# Patient Record
Sex: Female | Born: 1960 | Race: White | Hispanic: No | Smoking: Never smoker
Health system: Southern US, Community
[De-identification: ages and names within clinical notes are randomized; demographics above are authoritative.]

## PROBLEM LIST (undated history)

## (undated) DIAGNOSIS — J302 Other seasonal allergic rhinitis: Secondary | ICD-10-CM

## (undated) DIAGNOSIS — S72009A Fracture of unspecified part of neck of unspecified femur, initial encounter for closed fracture: Secondary | ICD-10-CM

## (undated) DIAGNOSIS — M419 Scoliosis, unspecified: Secondary | ICD-10-CM

## (undated) DIAGNOSIS — I341 Nonrheumatic mitral (valve) prolapse: Secondary | ICD-10-CM

## (undated) DIAGNOSIS — R7303 Prediabetes: Secondary | ICD-10-CM

## (undated) DIAGNOSIS — E039 Hypothyroidism, unspecified: Secondary | ICD-10-CM

## (undated) DIAGNOSIS — I10 Essential (primary) hypertension: Secondary | ICD-10-CM

## (undated) HISTORY — DX: Nonrheumatic mitral (valve) prolapse: I34.1

## (undated) HISTORY — DX: Fracture of unspecified part of neck of unspecified femur, initial encounter for closed fracture: S72.009A

## (undated) HISTORY — DX: Scoliosis, unspecified: M41.9

## (undated) HISTORY — PX: WISDOM TOOTH EXTRACTION: SHX21

---
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
## 1985-03-12 DIAGNOSIS — S72009A Fracture of unspecified part of neck of unspecified femur, initial encounter for closed fracture: Secondary | ICD-10-CM

## 1985-03-12 HISTORY — DX: Fracture of unspecified part of neck of unspecified femur, initial encounter for closed fracture: S72.009A

## 2012-06-04 LAB — PROGESTERONE
Progression: 20
Vitamin D3 1, 25 (OH)2: 25

## 2012-06-04 LAB — HEMOGLOBIN A1C: HEMOGLOBIN A1C: 5.5 % (ref 4.0–6.0)

## 2012-06-04 LAB — ESTRADIOL: Estradiol: 26

## 2013-12-02 LAB — LIPID PANEL
CHOLESTEROL: 178 mg/dL (ref 0–200)
HDL: 56 mg/dL (ref 35–70)
LDL Cholesterol: 91 mg/dL
Triglycerides: 154 mg/dL (ref 40–160)

## 2013-12-02 LAB — CBC AND DIFFERENTIAL
Hemoglobin: 13.1 g/dL (ref 12.0–16.0)
Platelets: 287 10*3/uL (ref 150–399)
WBC: 5.9 10^3/mL

## 2013-12-02 LAB — BASIC METABOLIC PANEL
Creatinine: 0.8 mg/dL (ref 0.5–1.1)
Glucose: 83 mg/dL
POTASSIUM: 4.1 mmol/L (ref 3.4–5.3)
Sodium: 141 mmol/L (ref 137–147)

## 2013-12-02 LAB — CMP14+EGFR
Calcium: 8.9 mg/dL
Chloride: 105 mmol/L

## 2013-12-02 LAB — HEPATIC FUNCTION PANEL
ALT: 59 U/L — AB (ref 7–35)
AST: 30 U/L (ref 13–35)

## 2013-12-02 LAB — HEMOGLOBIN A1C: Hgb A1c MFr Bld: 5.6 % (ref 4.0–6.0)

## 2014-05-18 ENCOUNTER — Ambulatory Visit (INDEPENDENT_AMBULATORY_CARE_PROVIDER_SITE_OTHER): Payer: Self-pay | Admitting: Family Medicine

## 2014-05-18 ENCOUNTER — Encounter: Payer: Self-pay | Admitting: Family Medicine

## 2014-05-18 VITALS — BP 146/87 | HR 86 | Ht 67.0 in | Wt 216.0 lb

## 2014-05-18 DIAGNOSIS — R030 Elevated blood-pressure reading, without diagnosis of hypertension: Secondary | ICD-10-CM

## 2014-05-18 DIAGNOSIS — E038 Other specified hypothyroidism: Secondary | ICD-10-CM

## 2014-05-18 DIAGNOSIS — R7301 Impaired fasting glucose: Secondary | ICD-10-CM

## 2014-05-18 DIAGNOSIS — E559 Vitamin D deficiency, unspecified: Secondary | ICD-10-CM | POA: Insufficient documentation

## 2014-05-18 DIAGNOSIS — Z79890 Hormone replacement therapy (postmenopausal): Secondary | ICD-10-CM

## 2014-05-18 DIAGNOSIS — Z780 Asymptomatic menopausal state: Secondary | ICD-10-CM

## 2014-05-18 DIAGNOSIS — E039 Hypothyroidism, unspecified: Secondary | ICD-10-CM | POA: Insufficient documentation

## 2014-05-18 DIAGNOSIS — IMO0001 Reserved for inherently not codable concepts without codable children: Secondary | ICD-10-CM

## 2014-05-18 DIAGNOSIS — J309 Allergic rhinitis, unspecified: Secondary | ICD-10-CM | POA: Insufficient documentation

## 2014-05-18 DIAGNOSIS — J302 Other seasonal allergic rhinitis: Secondary | ICD-10-CM

## 2014-05-18 MED ORDER — THYROID 32.5 MG PO TABS: 32.5000 mg | ORAL_TABLET | Freq: Two times a day (BID) | ORAL | Status: DC

## 2014-05-18 NOTE — Progress Notes (Signed)
Subjective:    Patient ID: Kayla Taylor, female    DOB: 07-Feb-1961, 54 y.o.   MRN: [id number]  HPI Patient is here today to establish care. Her previous primary care provider is no longer covered under her new insurance plan she has a new job. She is on hormone replacement therapy and has a GYN. Though she is in the middle of switching GYN because of insurance reasons. She is addressing some bleeding issues with them. She is a Education administrator at [employer]. She is widowed and lives alone.  She is currently on estradiol tab & Progesterone At Bedtime. She Also Nita Sells Thyroid 32.5 Mg Tab Twice a Day.  Has been on these for about 2.5-3 yrs.  Her hotflashes have stopped.  Thyroid has been ajusted several times over the last few years.  Uses the progesterone at bedtime to help her sleep and works well.    She also takes several supplements including a multivitamin, joint supplement, Zyrtec, vitamin D3, pregnant alone, melatonin and Aleve as needed.  Also takes maca to help promote testosterone levels.   Review of Systems  Constitutional: Negative for fever, diaphoresis and unexpected weight change.  HENT: Negative for hearing loss, rhinorrhea and tinnitus.   Eyes: Negative for visual disturbance.  Respiratory: Negative for cough and wheezing.   Cardiovascular: Negative for chest pain and palpitations.  Gastrointestinal: Negative for nausea, vomiting, diarrhea and blood in stool.  Genitourinary: Positive for vaginal bleeding. Negative for vaginal discharge and difficulty urinating.  Musculoskeletal: Negative for myalgias and arthralgias.  Skin: Negative for rash.  Neurological: Negative for headaches.  Hematological: Negative for adenopathy. Does not bruise/bleed easily.  Psychiatric/Behavioral: Negative for sleep disturbance and dysphoric mood. The patient is not nervous/anxious.    BP 146/87 mmHg  Pulse 86  Ht 5\' 7"  (1.702 m)  Wt 216 lb (97.977 kg)  BMI 33.82  kg/m2  SpO2 94%    Allergies  Allergen Reactions  . Sulfa Antibiotics Rash    No past medical history on file.  No past surgical history on file.  History   Social History  . Marital Status: Widowed    Spouse Name: N/A  . Number of Children: N/A  . Years of Education: N/A   Occupational History  . Not on file.   Social History Main Topics  . Smoking status: Never Smoker   . Smokeless tobacco: Not on file  . Alcohol Use: 0.6 oz/week    1 Standard drinks or equivalent per week  . Drug Use: No  . Sexual Activity:    Partners: Male   Other Topics Concern  . Not on file   Social History Narrative    No family history on file.  Outpatient Encounter Prescriptions as of 05/18/2014  Medication Sig  . cetirizine (ZYRTEC) 10 MG tablet Take 10 mg by mouth.  . Cholecalciferol 5000 UNITS capsule Take 1 Units by mouth.   . estradiol (ESTRACE) 2 MG tablet Take 2 mg by mouth.  . Glucosamine-Chondroitin 500-400 MG CAPS Take by mouth.  . Maca 500 MG CAPS Take by mouth.  . Melatonin-GABA-Valerian 09-08-48 MG TABS Take by mouth.  . Multiple Vitamin (THERA) TABS Take 1 tablet by mouth.  . naproxen sodium (ANAPROX) 220 MG tablet Take 220 mg by mouth 2 (two) times daily with a meal.  . Pregnenolone Micronized POWD by Does not apply route.  . progesterone (ENDOMETRIN) 100 MG vaginal insert Place 100 mg vaginally.  . [DISCONTINUED] thyroid (ARMOUR) 32.5 MG  tablet Take 32.5 mg by mouth.  . thyroid (ARMOUR) 32.5 MG tablet Take 1 tablet (32.5 mg total) by mouth 2 (two) times daily. Nature thyroid 32.5mg  please          Objective:   Physical Exam  Constitutional: She is oriented to person, place, and time. She appears well-developed and well-nourished.  HENT:  Head: Normocephalic and atraumatic.  Right Ear: External ear normal.  Left Ear: External ear normal.  Nose: Nose normal.  Eyes: Conjunctivae are normal.  Neck: Neck supple. No thyromegaly present.  Cardiovascular: Normal  rate, regular rhythm and normal heart sounds.   No carotid bruits. Radial pulses 2+ bilat.   Pulmonary/Chest: Effort normal and breath sounds normal.  Lymphadenopathy:    She has no cervical adenopathy.  Neurological: She is alert and oriented to person, place, and time.  Skin: Skin is warm and dry.  Psychiatric: She has a normal mood and affect. Her behavior is normal. Judgment and thought content normal.        Assessment & Plan:  HRT - will continue current regimen. She is happy with what she is on.  Will hold off on rechecking labs right now as is doing well and she has a very high decutible.   Hypothyroid - wil recheck thyroid. Due for refills.    Vit D deficicney - She is on supplementatoin.  Due to recheck.   AR- uses Zyrtec prn for this.   Hx of IFG - due for repeat A1C.  Will call with reuslts and adjust if needed.

## 2014-05-19 ENCOUNTER — Encounter: Payer: Self-pay | Admitting: Family Medicine

## 2014-05-19 DIAGNOSIS — R7301 Impaired fasting glucose: Secondary | ICD-10-CM | POA: Insufficient documentation

## 2014-05-19 DIAGNOSIS — Z79890 Hormone replacement therapy (postmenopausal): Secondary | ICD-10-CM | POA: Insufficient documentation

## 2014-05-25 LAB — COMPLETE METABOLIC PANEL WITH GFR
ALK PHOS: 66 U/L (ref 39–117)
ALT: 19 U/L (ref 0–35)
AST: 15 U/L (ref 0–37)
Albumin: 4 g/dL (ref 3.5–5.2)
BUN: 15 mg/dL (ref 6–23)
CO2: 29 mEq/L (ref 19–32)
Calcium: 9.1 mg/dL (ref 8.4–10.5)
Chloride: 104 mEq/L (ref 96–112)
Creat: 0.83 mg/dL (ref 0.50–1.10)
GFR, EST NON AFRICAN AMERICAN: 82 mL/min
GLUCOSE: 95 mg/dL (ref 70–99)
Potassium: 4.1 mEq/L (ref 3.5–5.3)
SODIUM: 139 meq/L (ref 135–145)
Total Bilirubin: 0.4 mg/dL (ref 0.2–1.2)
Total Protein: 6.7 g/dL (ref 6.0–8.3)

## 2014-05-25 LAB — LIPID PANEL
Cholesterol: 168 mg/dL (ref 0–200)
HDL: 57 mg/dL (ref >=46)
LDL CALC: 89 mg/dL (ref 0–99)
Total CHOL/HDL Ratio: 2.9 Ratio
Triglycerides: 111 mg/dL (ref <150)
VLDL: 22 mg/dL (ref 0–40)

## 2014-05-25 LAB — TSH: TSH: 3.934 u[IU]/mL (ref 0.350–4.500)

## 2014-05-25 LAB — HEMOGLOBIN A1C
HEMOGLOBIN A1C: 6 % — AB (ref <5.7)
Mean Plasma Glucose: 126 mg/dL — ABNORMAL HIGH (ref <117)

## 2014-05-25 LAB — VITAMIN D 25 HYDROXY (VIT D DEFICIENCY, FRACTURES): Vit D, 25-Hydroxy: 41 ng/mL (ref 30–100)

## 2014-05-27 ENCOUNTER — Other Ambulatory Visit: Payer: Self-pay | Admitting: *Deleted

## 2014-05-27 DIAGNOSIS — E039 Hypothyroidism, unspecified: Secondary | ICD-10-CM

## 2014-05-27 MED ORDER — THYROID 48.75 MG PO TABS: ORAL_TABLET | ORAL | Status: DC

## 2014-05-27 NOTE — Addendum Note (Signed)
Addended by: Beatrice Lecher D on: 05/27/2014 12:39 PM   Modules accepted: Orders

## 2014-06-02 ENCOUNTER — Encounter: Payer: Self-pay | Admitting: Family Medicine

## 2014-06-25 ENCOUNTER — Encounter: Payer: Self-pay | Admitting: Family Medicine

## 2014-07-29 ENCOUNTER — Encounter (HOSPITAL_COMMUNITY): Payer: Self-pay | Admitting: Licensed Clinical Social Worker

## 2014-07-29 ENCOUNTER — Ambulatory Visit (INDEPENDENT_AMBULATORY_CARE_PROVIDER_SITE_OTHER): Payer: Commercial Managed Care - PPO | Admitting: Licensed Clinical Social Worker

## 2014-07-29 DIAGNOSIS — F328 Other depressive episodes: Secondary | ICD-10-CM | POA: Diagnosis not present

## 2014-07-29 DIAGNOSIS — F341 Dysthymic disorder: Secondary | ICD-10-CM
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
## 2014-08-06 ENCOUNTER — Telehealth: Payer: Self-pay

## 2014-08-06 MED ORDER — ESTRADIOL 2 MG PO TABS: 2.0000 mg | ORAL_TABLET | Freq: Every day | ORAL | Status: DC

## 2014-08-06 NOTE — Telephone Encounter (Signed)
Ok to refill 

## 2014-08-06 NOTE — Telephone Encounter (Signed)
Patient called for a refill of estradiol. Historical medication.

## 2014-08-06 NOTE — Telephone Encounter (Signed)
Refill sent.

## 2014-08-10 ENCOUNTER — Ambulatory Visit (HOSPITAL_COMMUNITY): Payer: Self-pay | Admitting: Licensed Clinical Social Worker

## 2014-08-26 ENCOUNTER — Ambulatory Visit (HOSPITAL_COMMUNITY): Payer: Self-pay | Admitting: Licensed Clinical Social Worker

## 2014-09-06 ENCOUNTER — Ambulatory Visit (HOSPITAL_COMMUNITY): Payer: Self-pay | Admitting: Licensed Clinical Social Worker

## 2014-09-29 ENCOUNTER — Ambulatory Visit (HOSPITAL_COMMUNITY): Payer: Self-pay | Admitting: Licensed Clinical Social Worker

## 2014-10-05 ENCOUNTER — Ambulatory Visit (INDEPENDENT_AMBULATORY_CARE_PROVIDER_SITE_OTHER): Payer: Managed Care, Other (non HMO) | Admitting: Family Medicine

## 2014-10-05 ENCOUNTER — Encounter: Payer: Self-pay | Admitting: Family Medicine

## 2014-10-05 VITALS — BP 159/100 | HR 89 | Ht 67.0 in | Wt 215.0 lb

## 2014-10-05 DIAGNOSIS — E038 Other specified hypothyroidism: Secondary | ICD-10-CM | POA: Diagnosis not present

## 2014-10-05 DIAGNOSIS — L02419 Cutaneous abscess of limb, unspecified: Secondary | ICD-10-CM

## 2014-10-05 MED ORDER — DOXYCYCLINE HYCLATE 100 MG PO TABS: 100.0000 mg | ORAL_TABLET | Freq: Two times a day (BID) | ORAL | Status: DC

## 2014-10-05 MED ORDER — THYROID 48.75 MG PO TABS: ORAL_TABLET | ORAL | Status: DC

## 2014-10-05 NOTE — Patient Instructions (Signed)

## 2014-10-05 NOTE — Progress Notes (Signed)
   Subjective:    Patient ID: Kayla Taylor, female    DOB: [birth year], 54 y.o.   MRN: [id number]  HPI Abscess x 3wks started hard and got bigger opened up on sunday and is draining some.  No fever, chills or sweats.  No red streaks.  She says it is smaller than it was a week ago.  No worsening or alleviating factos.    Hypothyroidism- due to recheck levels. We actually increased her thyroid medication about 3 months ago. She's due to recheck level.  Review of Systems     Objective:   Physical Exam  Constitutional: She appears well-developed and well-nourished.  HENT:  Head: Normocephalic and atraumatic.  Musculoskeletal:  She has a 3.5 cm abscess under the left axilla.  The is a small wound on the surface with a scab.  No streaking erythema or sign of cellulitis. It is tender to palpation. Unable to actively express any drainage from the wound.  Skin: Skin is warm and dry.  Psychiatric: She has a normal mood and affect. Her behavior is normal.          Assessment & Plan:  Abscess - offered to drain it but she would prefer to try an antibiotic since still draining some.  Discussed the importance of returning if it does not go down by the end of the week or come in sooner if she develops any red streaks around the area, it gets larger or she develops any fevers chills or sweats. Continue with warm compresses to encourage drainage.  Hypothyroidism-due to recheck TSH. Adjust dose if needed.

## 2014-10-06 LAB — TSH: TSH: 2.006 u[IU]/mL (ref 0.350–4.500)

## 2014-10-13 ENCOUNTER — Ambulatory Visit (HOSPITAL_COMMUNITY): Payer: Self-pay | Admitting: Licensed Clinical Social Worker

## 2014-10-27 ENCOUNTER — Ambulatory Visit (HOSPITAL_COMMUNITY): Payer: Self-pay | Admitting: Licensed Clinical Social Worker

## 2014-11-10 ENCOUNTER — Ambulatory Visit (HOSPITAL_COMMUNITY): Payer: Self-pay | Admitting: Licensed Clinical Social Worker

## 2014-11-11 ENCOUNTER — Encounter: Payer: Self-pay | Admitting: Obstetrics & Gynecology

## 2014-11-11 ENCOUNTER — Ambulatory Visit (INDEPENDENT_AMBULATORY_CARE_PROVIDER_SITE_OTHER): Payer: Managed Care, Other (non HMO) | Admitting: Obstetrics & Gynecology

## 2014-11-11 VITALS — BP 148/84 | HR 88 | Ht 67.0 in | Wt 214.0 lb

## 2014-11-11 DIAGNOSIS — N950 Postmenopausal bleeding: Secondary | ICD-10-CM

## 2014-11-11 DIAGNOSIS — Z0000 Encounter for general adult medical examination without abnormal findings: Secondary | ICD-10-CM

## 2014-11-11 MED ORDER — MISOPROSTOL 200 MCG PO TABS: ORAL_TABLET | ORAL | Status: DC

## 2014-11-11 NOTE — Progress Notes (Signed)
   Subjective:    Patient ID: Kayla Taylor, female    DOB: Aug 11, 1960, 54 y.o.   MRN: [id number]  HPI  54 yo SWP0 here with the complaint of PMB. Menopause about 2 1/2 years ago. She started having bleeding 11-15.   Review of Systems Pap 10/15, mammogram at 54 yo.    Objective:   Physical Exam WNWHWFNAD Breathing, conversing, and ambulating normally       Assessment & Plan:  Preventative- schedule mammo PMB- check gyn u/s Pretreat with cytotec prior to [facility]

## 2014-11-18 ENCOUNTER — Ambulatory Visit: Payer: Commercial Managed Care - PPO | Admitting: Family Medicine

## 2014-11-19 ENCOUNTER — Ambulatory Visit (INDEPENDENT_AMBULATORY_CARE_PROVIDER_SITE_OTHER): Payer: Managed Care, Other (non HMO)

## 2014-11-19 DIAGNOSIS — N950 Postmenopausal bleeding: Secondary | ICD-10-CM

## 2014-11-25 ENCOUNTER — Ambulatory Visit: Payer: Self-pay | Admitting: Obstetrics & Gynecology

## 2014-11-29 ENCOUNTER — Telehealth: Payer: Self-pay | Admitting: *Deleted

## 2014-11-29 NOTE — Telephone Encounter (Signed)
-----   Message from Emily Filbert, MD sent at 11/22/2014  9:53 AM EDT ----- Please make sure that she has an appt for a EMBX and pretreats with cytotec (I already prescribed it). Thanks

## 2014-11-29 NOTE — Telephone Encounter (Signed)
LM on voicemail that Dr Hulan Fray would like to do an Endometrial biopsy @ tomorrow's appt @ 4:00.  There is a RX at her pharmacy for Cytotec and she needs to pick it up today as use as prescribed.

## 2014-11-30 ENCOUNTER — Other Ambulatory Visit: Payer: Self-pay | Admitting: *Deleted

## 2014-11-30 ENCOUNTER — Ambulatory Visit: Payer: Self-pay | Admitting: Obstetrics & Gynecology

## 2014-11-30 DIAGNOSIS — Z01812 Encounter for preprocedural laboratory examination: Secondary | ICD-10-CM

## 2014-11-30 MED ORDER — MISOPROSTOL 200 MCG PO TABS: ORAL_TABLET | ORAL | Status: DC

## 2014-11-30 NOTE — Telephone Encounter (Signed)
Pt's appt for Endo BX was cancelled today due to Dr Hulan Fray being called back to the OR for an emergency.  She had already taken her cytotec in preparation for her procedure today so I called in a RF for Cytotec for her procedure on Thursday.

## 2014-12-02 ENCOUNTER — Encounter: Payer: Self-pay | Admitting: Obstetrics & Gynecology

## 2014-12-02 ENCOUNTER — Ambulatory Visit (INDEPENDENT_AMBULATORY_CARE_PROVIDER_SITE_OTHER): Payer: Managed Care, Other (non HMO) | Admitting: Obstetrics & Gynecology

## 2014-12-02 VITALS — BP 140/90 | HR 86 | Resp 16 | Ht 67.0 in | Wt 214.0 lb

## 2014-12-02 DIAGNOSIS — N950 Postmenopausal bleeding: Secondary | ICD-10-CM | POA: Diagnosis not present

## 2014-12-02 DIAGNOSIS — Z01812 Encounter for preprocedural laboratory examination: Secondary | ICD-10-CM

## 2014-12-02 LAB — POCT URINE PREGNANCY: Preg Test, Ur: NEGATIVE

## 2014-12-02 NOTE — Progress Notes (Signed)
   Subjective:    Patient ID: Kayla Taylor, female    DOB: 08-06-1960, 54 y.o.   MRN: [id number]  HPI  54 yo lady with PMB here for [facility]. She took cytotec last night.  Review of Systems     Objective:   Physical Exam WNWHWFNAD  UPT negative, consent signed, time out done Cervix prepped with betadine and grasped with a single tooth tenaculum Uterus sounded to 9 cm Pipelle used for 2 passes with a moderate amount of tissue obtained. She tolerated the procedure well.        Assessment & Plan:   PMB- await pathology

## 2014-12-08 ENCOUNTER — Telehealth: Payer: Self-pay | Admitting: *Deleted

## 2014-12-08 NOTE — Telephone Encounter (Signed)
LM on cell phone of path report not showing hyperplasia.

## 2014-12-13 ENCOUNTER — Encounter: Payer: Self-pay | Admitting: Obstetrics & Gynecology

## 2014-12-13 ENCOUNTER — Ambulatory Visit (INDEPENDENT_AMBULATORY_CARE_PROVIDER_SITE_OTHER): Payer: Managed Care, Other (non HMO) | Admitting: Obstetrics & Gynecology

## 2014-12-13 VITALS — BP 140/96 | HR 88 | Resp 16

## 2014-12-13 DIAGNOSIS — N950 Postmenopausal bleeding: Secondary | ICD-10-CM

## 2014-12-13 MED ORDER — MEDROXYPROGESTERONE ACETATE 10 MG PO TABS: 10.0000 mg | ORAL_TABLET | Freq: Every day | ORAL | Status: DC

## 2014-12-13 NOTE — Progress Notes (Signed)
   Subjective:    Patient ID: Kayla Taylor, female    DOB: 1961-01-12, 54 y.o.   MRN: [id number]  HPI  54 yo lady with PMB here for results. Pathology negative with polypoid tissue.  Review of Systems     Objective:   Physical Exam WNWHWFNAD Breathing, conversing, and ambulating normally       Assessment & Plan:  PMB- none since last month, Pathol. Negative I offered watchful waiting with d&c/h/s if PMB recurrs, going ahead with d&c/h/s now versus provera challenge. She opts for progestin. RTC prn/prn PMB

## 2014-12-16 ENCOUNTER — Ambulatory Visit: Payer: Self-pay | Admitting: Obstetrics & Gynecology

## 2014-12-23 ENCOUNTER — Ambulatory Visit: Payer: Self-pay | Admitting: Family Medicine

## 2015-01-01 ENCOUNTER — Emergency Department (INDEPENDENT_AMBULATORY_CARE_PROVIDER_SITE_OTHER): Payer: Managed Care, Other (non HMO)

## 2015-01-01 ENCOUNTER — Emergency Department
Admission: EM | Admit: 2015-01-01 | Discharge: 2015-01-01 | Disposition: A | Discharge status: Home / Self Care | Payer: Managed Care, Other (non HMO) | Source: Home / Self Care | Attending: Family Medicine | Admitting: Family Medicine

## 2015-01-01 DIAGNOSIS — J189 Pneumonia, unspecified organism: Secondary | ICD-10-CM

## 2015-01-01 DIAGNOSIS — J181 Lobar pneumonia, unspecified organism: Principal | ICD-10-CM

## 2015-01-01 DIAGNOSIS — J9811 Atelectasis: Secondary | ICD-10-CM

## 2015-01-01 LAB — POCT INFLUENZA A/B
Influenza A, POC: NEGATIVE
Influenza B, POC: NEGATIVE

## 2015-01-01 MED ORDER — BENZONATATE 100 MG PO CAPS: 100.0000 mg | ORAL_CAPSULE | Freq: Three times a day (TID) | ORAL | Status: DC

## 2015-01-01 MED ORDER — AZITHROMYCIN 250 MG PO TABS: 250.0000 mg | ORAL_TABLET | Freq: Every day | ORAL | Status: DC

## 2015-01-01 NOTE — ED Notes (Signed)
Patient states that she started feeling bad on Thursday, 12/30/14. States that she has had a scratchy throat and cough and has not felt well.

## 2015-01-01 NOTE — Discharge Instructions (Signed)
Please take antibiotics as prescribed and be sure to complete entire course even if you start to feel better to ensure infection does not come back. You may take 400-600mg  Ibuprofen (Motrin) every 6-8 hours for fever and pain  Alternate with Tylenol  You may take 500mg  Tylenol every 4-6 hours as needed for fever and pain  Follow-up with your primary care provider next week for recheck of symptoms if not improving.  Be sure to drink plenty of fluids and rest, at least 8hrs of sleep a night, preferably more while you are sick. Return urgent care or go to closest ER if you cannot keep down fluids/signs of dehydration, fever not reducing with Tylenol, difficulty breathing/wheezing, stiff neck, worsening condition, or other concerns (see below)    Community-Acquired Pneumonia, Adult Pneumonia is an infection of the lungs. There are different types of pneumonia. One type can develop while a person is in a hospital. A different type, called community-acquired pneumonia, develops in people who are not, or have not recently been, in the hospital or other health care facility.  CAUSES Pneumonia may be caused by bacteria, viruses, or funguses. Community-acquired pneumonia is often caused by Streptococcus pneumonia bacteria. These bacteria are often passed from one person to another by breathing in droplets from the cough or sneeze of an infected person. RISK FACTORS The condition is more likely to develop in:  People who havechronic diseases, such as chronic obstructive pulmonary disease (COPD), asthma, congestive heart failure, cystic fibrosis, diabetes, or kidney disease.  People who haveearly-stage or late-stage HIV.  People who havesickle cell disease.  People who havehad their spleen removed (splenectomy).  People who havepoor Human resources officer.  People who havemedical conditions that increase the risk of breathing in (aspirating) secretions their own mouth and nose.   People who havea  weakened immune system (immunocompromised).  People who smoke.  People whotravel to areas where pneumonia-causing germs commonly exist.  People whoare around animal habitats or animals that have pneumonia-causing germs, including birds, bats, rabbits, cats, and farm animals. SYMPTOMS Symptoms of this condition include:  Adry cough.  A wet (productive) cough.  Fever.  Sweating.  Chest pain, especially when breathing deeply or coughing.  Rapid breathing or difficulty breathing.  Shortness of breath.  Shaking chills.  Fatigue.  Muscle aches. DIAGNOSIS Your health care provider will take a medical history and perform a physical exam. You may also have other tests, including:  Imaging studies of your chest, including X-rays.  Tests to check your blood oxygen level and other blood gases.  Other tests on blood, mucus (sputum), fluid around your lungs (pleural fluid), and urine. If your pneumonia is severe, other tests may be done to identify the specific cause of your illness. TREATMENT The type of treatment that you receive depends on many factors, such as the cause of your pneumonia, the medicines you take, and other medical conditions that you have. For most adults, treatment and recovery from pneumonia may occur at home. In some cases, treatment must happen in a hospital. Treatment may include:  Antibiotic medicines, if the pneumonia was caused by bacteria.  Antiviral medicines, if the pneumonia was caused by a virus.  Medicines that are given by mouth or through an IV tube.  Oxygen.  Respiratory therapy. Although rare, treating severe pneumonia may include:  Mechanical ventilation. This is done if you are not breathing well on your own and you cannot maintain a safe blood oxygen level.  Thoracentesis. This procedureremoves fluid around one lung  or both lungs to help you breathe better. HOME CARE INSTRUCTIONS  Take over-the-counter and prescription medicines  only as told by your health care provider.  Only takecough medicine if you are losing sleep. Understand that cough medicine can prevent your body's natural ability to remove mucus from your lungs.  If you were prescribed an antibiotic medicine, take it as told by your health care provider. Do not stop taking the antibiotic even if you start to feel better.  Sleep in a semi-upright position at night. Try sleeping in a reclining chair, or place a few pillows under your head.  Do not use tobacco products, including cigarettes, chewing tobacco, and e-cigarettes. If you need help quitting, ask your health care provider.  Drink enough water to keep your urine clear or pale yellow. This will help to thin out mucus secretions in your lungs. PREVENTION There are ways that you can decrease your risk of developing community-acquired pneumonia. Consider getting a pneumococcal vaccine if:  You are older than 52 years of age.  You are older than 53 years of age and are undergoing cancer treatment, have chronic lung disease, or have other medical conditions that affect your immune system. Ask your health care provider if this applies to you. There are different types and schedules of pneumococcal vaccines. Ask your health care provider which vaccination option is best for you. You may also prevent community-acquired pneumonia if you take these actions:  Get an influenza vaccine every year. Ask your health care provider which type of influenza vaccine is best for you.  Go to the dentist on a regular basis.  Wash your hands often. Use hand sanitizer if soap and water are not available. SEEK MEDICAL CARE IF:  You have a fever.  You are losing sleep because you cannot control your cough with cough medicine. SEEK IMMEDIATE MEDICAL CARE IF:  You have worsening shortness of breath.  You have increased chest pain.  Your sickness becomes worse, especially if you are an older adult or have a weakened  immune system.  You cough up blood.   This information is not intended to replace advice given to you by your health care provider. Make sure you discuss any questions you have with your health care provider.   Document Released: 02/26/2005 Document Revised: 11/17/2014 Document Reviewed: 06/23/2014 Elsevier Interactive Patient Education [employer].
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
## 2015-01-10 ENCOUNTER — Other Ambulatory Visit: Payer: Self-pay | Admitting: Family Medicine

## 2015-01-25 ENCOUNTER — Encounter: Payer: Self-pay | Admitting: Family Medicine

## 2015-01-25 ENCOUNTER — Ambulatory Visit (INDEPENDENT_AMBULATORY_CARE_PROVIDER_SITE_OTHER): Payer: Managed Care, Other (non HMO) | Admitting: Family Medicine

## 2015-01-25 VITALS — BP 145/99 | HR 90 | Wt 211.1 lb

## 2015-01-25 DIAGNOSIS — E039 Hypothyroidism, unspecified: Secondary | ICD-10-CM

## 2015-01-25 DIAGNOSIS — R7301 Impaired fasting glucose: Secondary | ICD-10-CM | POA: Diagnosis not present

## 2015-01-25 DIAGNOSIS — Z1231 Encounter for screening mammogram for malignant neoplasm of breast: Secondary | ICD-10-CM | POA: Diagnosis not present

## 2015-01-25 LAB — POCT GLYCOSYLATED HEMOGLOBIN (HGB A1C): HEMOGLOBIN A1C: 5.8

## 2015-01-25 NOTE — Progress Notes (Signed)
   Subjective:    Patient ID: Kayla Taylor, female    DOB: [birth year], 54 y.o.   MRN: [id number]  HPI IFG - no increased thirst or urination. Last 11 A1c was 8 months ago and it was 6.0. She's not actively exercising.  Hypothyroidism - doing well on her current regimen. She is on thyroid 48.75 mg daily. Reaction increased her dose about 8 months ago. Repeat lab in July looked great. She still happy with her current regimen. No recent skin or hair changes. She is down 4 lbs.     Review of Systems     Objective:   Physical Exam  Constitutional: She is oriented to person, place, and time. She appears well-developed and well-nourished.  HENT:  Head: Normocephalic and atraumatic.  Neck: Neck supple. No thyromegaly present.  Cardiovascular: Normal rate, regular rhythm and normal heart sounds.   Pulmonary/Chest: Effort normal and breath sounds normal.  Musculoskeletal: She exhibits no edema.  Lymphadenopathy:    She has no cervical adenopathy.  Neurological: She is alert and oriented to person, place, and time.  Skin: Skin is warm and dry.  Psychiatric: She has a normal mood and affect. Her behavior is normal.          Assessment & Plan:  Impaired fasting glucose -well controlled. A1C is 5.9 . F/U in 6 months. She is doing fantastic. Did encourage her to get on track with diet and exercise.  Hypothyroidism - doing well. Happy with current regimen.  The adjustment to her regimen has been good. Follow-up in 6 months.

## 2015-02-11 ENCOUNTER — Other Ambulatory Visit: Payer: Self-pay | Admitting: Family Medicine

## 2015-02-21 ENCOUNTER — Other Ambulatory Visit: Payer: Self-pay | Admitting: Family Medicine

## 2015-03-31 ENCOUNTER — Ambulatory Visit (INDEPENDENT_AMBULATORY_CARE_PROVIDER_SITE_OTHER): Payer: Managed Care, Other (non HMO) | Admitting: Family Medicine

## 2015-03-31 ENCOUNTER — Encounter: Payer: Self-pay | Admitting: Family Medicine

## 2015-03-31 VITALS — BP 151/90 | HR 93 | Temp 99.6°F | Ht 67.0 in | Wt 207.0 lb

## 2015-03-31 DIAGNOSIS — J019 Acute sinusitis, unspecified: Secondary | ICD-10-CM | POA: Diagnosis not present

## 2015-03-31 DIAGNOSIS — J209 Acute bronchitis, unspecified: Secondary | ICD-10-CM

## 2015-03-31 MED ORDER — AMOXICILLIN-POT CLAVULANATE 875-125 MG PO TABS: 1.0000 | ORAL_TABLET | Freq: Two times a day (BID) | ORAL | Status: DC

## 2015-03-31 NOTE — Progress Notes (Signed)
   Subjective:    Patient ID: Kayla Taylor, female    DOB: Jul 28, 1960, 55 y.o.   MRN: [id number]  HPI 7 days of sinus congestion, cough. No fever, chills.  Hearing some crackles in her lungs. + low grade temp around 100. She is suing Dayquail and Nyquail and Affrin.  No HA. Concerned because she asked had pneumonia back in October but says she doesn't feel as bad now she did then. No shortness of breath. No worsening or alleviating factors.   Review of Systems     Objective:   Physical Exam  Constitutional: She is oriented to person, place, and time. She appears well-developed and well-nourished.  HENT:  Head: Normocephalic and atraumatic.  Cardiovascular: Normal rate, regular rhythm and normal heart sounds.   Pulmonary/Chest: Effort normal and breath sounds normal.  Neurological: She is alert and oriented to person, place, and time.  Skin: Skin is warm and dry.  Psychiatric: She has a normal mood and affect. Her behavior is normal.          Assessment & Plan:  Acute sinusitis/bronchits - treat with Augmentin. Please call back if not significantly better by Monday of next week. Okay to continue symptomatic care as she may need to back off on the DayQuil and NyQuil as I do think it may be elevating her blood pressure.

## 2015-04-18 ENCOUNTER — Other Ambulatory Visit: Payer: Self-pay | Admitting: Family Medicine

## 2015-05-30 ENCOUNTER — Encounter: Payer: Self-pay | Admitting: Obstetrics & Gynecology

## 2015-05-30 ENCOUNTER — Ambulatory Visit (INDEPENDENT_AMBULATORY_CARE_PROVIDER_SITE_OTHER): Payer: Managed Care, Other (non HMO) | Admitting: Obstetrics & Gynecology

## 2015-05-30 VITALS — BP 166/96 | HR 79 | Resp 16 | Ht 67.0 in | Wt 213.0 lb

## 2015-05-30 DIAGNOSIS — N950 Postmenopausal bleeding: Secondary | ICD-10-CM | POA: Diagnosis not present

## 2015-05-30 MED ORDER — MISOPROSTOL 200 MCG PO TABS: ORAL_TABLET | ORAL | Status: DC

## 2015-05-30 NOTE — Progress Notes (Signed)
   Subjective:    Patient ID: Kayla Taylor, female    DOB: 05/20/1960, 55 y.o.   MRN: [id number]  HPI  55 yo [employer] P0 here today to discuss the recurrence of PMB over the past 32 weeks. She had this same issue about 53 months ago. Her u/s showed a small fribroid and her uterine linging was about 10 cm. Her EMBX then was normal. She has been taking HRT for about 4 years. Takes estradiol 2 mg daily and progesterone 100 mg orally nightly (prometrium).   Review of Systems She has MVP but does not get antibiotics with dental cleanings.    Objective:   Physical Exam [id number] Breathing, conversing, and ambulating normally       Assessment & Plan:  Second occurrence of PMB- I will schedule her for a hysteroscopy and d&c. Pretreat with cytotec

## 2015-06-01 ENCOUNTER — Encounter (HOSPITAL_COMMUNITY): Payer: Self-pay | Admitting: *Deleted

## 2015-06-02 ENCOUNTER — Other Ambulatory Visit: Payer: Self-pay | Admitting: *Deleted

## 2015-06-02 MED ORDER — PROGESTERONE MICRONIZED 100 MG PO CAPS: 100.0000 mg | ORAL_CAPSULE | Freq: Every day | ORAL | Status: DC

## 2015-06-06 ENCOUNTER — Encounter (HOSPITAL_COMMUNITY): Payer: Self-pay | Admitting: *Deleted

## 2015-06-09 ENCOUNTER — Other Ambulatory Visit: Payer: Self-pay | Admitting: Family Medicine

## 2015-06-14 ENCOUNTER — Encounter (HOSPITAL_COMMUNITY): Payer: Self-pay | Admitting: *Deleted

## 2015-06-14 ENCOUNTER — Ambulatory Visit (HOSPITAL_COMMUNITY): Payer: Managed Care, Other (non HMO) | Admitting: Anesthesiology

## 2015-06-14 ENCOUNTER — Ambulatory Visit (HOSPITAL_COMMUNITY)
Admission: RE | Admit: 2015-06-14 | Discharge: 2015-06-14 | Disposition: A | Discharge status: Home / Self Care | Payer: Managed Care, Other (non HMO) | Source: Ambulatory Visit | Attending: Obstetrics & Gynecology | Admitting: Obstetrics & Gynecology

## 2015-06-14 ENCOUNTER — Encounter (HOSPITAL_COMMUNITY)
Admission: RE | Disposition: A | Discharge status: Home / Self Care | Payer: Self-pay | Source: Ambulatory Visit | Attending: Obstetrics & Gynecology

## 2015-06-14 DIAGNOSIS — E039 Hypothyroidism, unspecified: Secondary | ICD-10-CM | POA: Insufficient documentation

## 2015-06-14 DIAGNOSIS — I341 Nonrheumatic mitral (valve) prolapse: Secondary | ICD-10-CM | POA: Insufficient documentation

## 2015-06-14 DIAGNOSIS — M419 Scoliosis, unspecified: Secondary | ICD-10-CM | POA: Insufficient documentation

## 2015-06-14 DIAGNOSIS — N950 Postmenopausal bleeding: Secondary | ICD-10-CM | POA: Insufficient documentation

## 2015-06-14 HISTORY — DX: Hypothyroidism, unspecified: E03.9

## 2015-06-14 HISTORY — DX: Other seasonal allergic rhinitis: J30.2

## 2015-06-14 HISTORY — PX: HYSTEROSCOPY WITH D & C: SHX1775

## 2015-06-14 LAB — CBC
HEMATOCRIT: 40.8 % (ref 36.0–46.0)
Hemoglobin: 13.8 g/dL (ref 12.0–15.0)
MCH: 30.5 pg (ref 26.0–34.0)
MCHC: 33.8 g/dL (ref 30.0–36.0)
MCV: 90.1 fL (ref 78.0–100.0)
Platelets: 269 10*3/uL (ref 150–400)
RBC: 4.53 MIL/uL (ref 3.87–5.11)
RDW: 13.3 % (ref 11.5–15.5)
WBC: 7.4 10*3/uL (ref 4.0–10.5)

## 2015-06-14 SURGERY — DILATATION AND CURETTAGE /HYSTEROSCOPY
Anesthesia: General | Site: Vagina

## 2015-06-14 MED ORDER — LIDOCAINE HCL (CARDIAC) 20 MG/ML IV SOLN
INTRAVENOUS | Status: AC
  Filled 2015-06-14: qty 5

## 2015-06-14 MED ORDER — LACTATED RINGERS IV SOLN: INTRAVENOUS | Status: DC

## 2015-06-14 MED ORDER — BUPIVACAINE HCL (PF) 0.5 % IJ SOLN
INTRAMUSCULAR | Status: DC | PRN
  Administered 2015-06-14: 10 mL

## 2015-06-14 MED ORDER — ONDANSETRON HCL 4 MG/2ML IJ SOLN
INTRAMUSCULAR | Status: DC | PRN
  Administered 2015-06-14: 4 mg via INTRAVENOUS

## 2015-06-14 MED ORDER — DEXAMETHASONE SODIUM PHOSPHATE 10 MG/ML IJ SOLN
INTRAMUSCULAR | Status: AC
  Filled 2015-06-14: qty 1

## 2015-06-14 MED ORDER — ONDANSETRON HCL 4 MG/2ML IJ SOLN
INTRAMUSCULAR | Status: AC
  Filled 2015-06-14: qty 2

## 2015-06-14 MED ORDER — PROPOFOL 10 MG/ML IV BOLUS
INTRAVENOUS | Status: AC
  Filled 2015-06-14: qty 20

## 2015-06-14 MED ORDER — KETOROLAC TROMETHAMINE 30 MG/ML IJ SOLN
INTRAMUSCULAR | Status: AC
  Filled 2015-06-14: qty 1

## 2015-06-14 MED ORDER — LACTATED RINGERS IV SOLN
INTRAVENOUS | Status: DC
  Administered 2015-06-14 (×2): via INTRAVENOUS

## 2015-06-14 MED ORDER — FENTANYL CITRATE (PF) 100 MCG/2ML IJ SOLN
INTRAMUSCULAR | Status: DC | PRN
  Administered 2015-06-14 (×2): 50 ug via INTRAVENOUS

## 2015-06-14 MED ORDER — SODIUM CHLORIDE 0.9 % IR SOLN
Status: DC | PRN
  Administered 2015-06-14: 3000 mL

## 2015-06-14 MED ORDER — FENTANYL CITRATE (PF) 100 MCG/2ML IJ SOLN
INTRAMUSCULAR | Status: AC
  Filled 2015-06-14: qty 2

## 2015-06-14 MED ORDER — LIDOCAINE HCL (CARDIAC) 20 MG/ML IV SOLN
INTRAVENOUS | Status: DC | PRN
  Administered 2015-06-14: 100 mg via INTRAVENOUS

## 2015-06-14 MED ORDER — PROPOFOL 10 MG/ML IV BOLUS
INTRAVENOUS | Status: DC | PRN
  Administered 2015-06-14: 200 mg via INTRAVENOUS

## 2015-06-14 MED ORDER — IBUPROFEN 600 MG PO TABS: 600.0000 mg | ORAL_TABLET | Freq: Four times a day (QID) | ORAL | Status: DC | PRN

## 2015-06-14 MED ORDER — DEXAMETHASONE SODIUM PHOSPHATE 4 MG/ML IJ SOLN
INTRAMUSCULAR | Status: DC | PRN
  Administered 2015-06-14: 10 mg via INTRAVENOUS

## 2015-06-14 MED ORDER — MIDAZOLAM HCL 5 MG/5ML IJ SOLN
INTRAMUSCULAR | Status: DC | PRN
  Administered 2015-06-14: 2 mg via INTRAVENOUS

## 2015-06-14 MED ORDER — MIDAZOLAM HCL 2 MG/2ML IJ SOLN
INTRAMUSCULAR | Status: AC
  Filled 2015-06-14: qty 2

## 2015-06-14 MED ORDER — FENTANYL CITRATE (PF) 100 MCG/2ML IJ SOLN: 25.0000 ug | INTRAMUSCULAR | Status: DC | PRN

## 2015-06-14 MED ORDER — SCOPOLAMINE 1 MG/3DAYS TD PT72
1.0000 | MEDICATED_PATCH | Freq: Once | TRANSDERMAL | Status: DC
  Administered 2015-06-14: 1.5 mg via TRANSDERMAL

## 2015-06-14 MED ORDER — SCOPOLAMINE 1 MG/3DAYS TD PT72
MEDICATED_PATCH | TRANSDERMAL | Status: AC
  Filled 2015-06-14: qty 1

## 2015-06-14 MED ORDER — KETOROLAC TROMETHAMINE 30 MG/ML IJ SOLN
INTRAMUSCULAR | Status: DC | PRN
  Administered 2015-06-14: 30 mg via INTRAVENOUS

## 2015-06-14 MED ORDER — BUPIVACAINE HCL (PF) 0.5 % IJ SOLN
INTRAMUSCULAR | Status: AC
  Filled 2015-06-14: qty 30

## 2015-06-14 SURGICAL SUPPLY — 18 items
CANISTER SUCT 3000ML (MISCELLANEOUS) ×2 IMPLANT
CATH ROBINSON RED A/P 16FR (CATHETERS) ×2 IMPLANT
CLOTH BEACON ORANGE TIMEOUT ST (SAFETY) ×2 IMPLANT
CONTAINER PREFILL 10% NBF 60ML (FORM) ×4 IMPLANT
DILATOR CANAL MILEX (MISCELLANEOUS) IMPLANT
ELECT REM PT RETURN 9FT ADLT (ELECTROSURGICAL) ×2
ELECTRODE REM PT RTRN 9FT ADLT (ELECTROSURGICAL) ×1 IMPLANT
GLOVE BIO SURGEON STRL SZ 6.5 (GLOVE) ×2 IMPLANT
GLOVE BIOGEL PI IND STRL 7.0 (GLOVE) ×1 IMPLANT
GLOVE BIOGEL PI INDICATOR 7.0 (GLOVE) ×1
GOWN STRL REUS W/TWL LRG LVL3 (GOWN DISPOSABLE) ×4 IMPLANT
LOOP ANGLED CUTTING 22FR (CUTTING LOOP) IMPLANT
PACK VAGINAL MINOR WOMEN LF (CUSTOM PROCEDURE TRAY) ×2 IMPLANT
PAD OB MATERNITY 4.3X12.25 (PERSONAL CARE ITEMS) ×2 IMPLANT
TOWEL OR 17X24 6PK STRL BLUE (TOWEL DISPOSABLE) ×4 IMPLANT
TUBING AQUILEX INFLOW (TUBING) ×2 IMPLANT
TUBING AQUILEX OUTFLOW (TUBING) ×2 IMPLANT
WATER STERILE IRR 1000ML POUR (IV SOLUTION) ×2 IMPLANT

## 2015-06-14 NOTE — Anesthesia Postprocedure Evaluation (Signed)
Anesthesia Post Note  Patient: Kayla Taylor  Procedure(s) Performed: Procedure(s) (LRB): DILATATION AND CURETTAGE /HYSTEROSCOPY (N/A)  Patient location during evaluation: PACU Anesthesia Type: General Level of consciousness: awake and alert Pain management: pain level controlled Vital Signs Assessment: post-procedure vital signs reviewed and stable Respiratory status: spontaneous breathing, nonlabored ventilation, respiratory function stable and patient connected to nasal cannula oxygen Cardiovascular status: blood pressure returned to baseline and stable Postop Assessment: no signs of nausea or vomiting Anesthetic complications: no    Last Vitals:  Filed Vitals:   06/14/15 1315 06/14/15 1320  BP: 141/92   Pulse: 63 80  Temp: 36.8 C   Resp: 9 18    Last Pain: There were no vitals filed for this visit.               Renatta Shrieves L

## 2015-06-14 NOTE — Anesthesia Procedure Notes (Signed)
Procedure Name: LMA Insertion Date/Time: 06/14/2015 11:59 AM Performed by: Riki Sheer Pre-anesthesia Checklist: Patient identified, Emergency Drugs available, Suction available, Patient being monitored and Timeout performed Patient Re-evaluated:Patient Re-evaluated prior to inductionOxygen Delivery Method: Circle system utilized Preoxygenation: Pre-oxygenation with 100% oxygen Intubation Type: IV induction Ventilation: Mask ventilation without difficulty LMA: LMA inserted LMA Size: 4.0 Number of attempts: 1 Placement Confirmation: positive ETCO2,  CO2 detector and breath sounds checked- equal and bilateral Tube secured with: Tape Dental Injury: Teeth and Oropharynx as per pre-operative assessment

## 2015-06-14 NOTE — Op Note (Signed)
06/14/2015  12:23 PM  PATIENT:  Kayla Taylor  55 y.o. female  PRE-OPERATIVE DIAGNOSIS:  cpt [phone] - Post menopausal bleeding  POST-OPERATIVE DIAGNOSIS:  cpt [id number] - Post menopausal bleeding  PROCEDURE:  Procedure(s): DILATATION AND CURETTAGE /HYSTEROSCOPY (N/A)  SURGEON:  Surgeon(s) and Role:    * Emily Filbert, MD - Primary  ANESTHESIA:   general  EBL:  Total I/O In: 1300 [I.V.:1300] Out: 5 [Blood:5]  BLOOD ADMINISTERED:none  DRAINS: none   LOCAL MEDICATIONS USED:  MARCAINE     SPECIMEN:  Source of Specimen:  uterine curettings  DISPOSITION OF SPECIMEN:  PATHOLOGY  COUNTS:  YES  TOURNIQUET:  * No tourniquets in log *  DICTATION: .Dragon Dictation  PLAN OF CARE: Discharge to home after PACU  PATIENT DISPOSITION:  PACU - hemodynamically stable.   Delay start of Pharmacological VTE agent (>24hrs) due to surgical blood loss or risk of bleeding: not applicable    The risks, benefits, and alternatives of surgery were explained, understood, and accepted. All questions were answered. Consents were signed. In the operating room general anesthesia was applied without complication, and she was placed in the dorsal lithotomy position. Her vagina was prepped and draped in the usual sterile fashion. A Robinson catheter was used to drain her bladder. A bimanual exam revealed a small anteverted , anteverted mobile uterus. Her adnexa were nonenlarged. A speculum was placed and a single-tooth tenaculum was used to grasp the anterior lip of her cervix. A total of 10 mL of 0.5% Marcaine was used to perform a paracervical block. Her uterus sounded to 9 cm. Her cervix was carefully and slowly dilated to accommodate a small curette. Hysteroscopy showed a very atrophic endometrium and no abnormalities.  A curettage was done in all quadrants and the fundus of the uterus. A small amount of  tissue was obtained. A gritty sensation was appreciated throughout. There was no bleeding noted at the end of  the case. She was taken to the recovery room after being extubated. She tolerated the procedure well.
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
## 2015-06-14 NOTE — Transfer of Care (Signed)
Immediate Anesthesia Transfer of Care Note  Patient: Kayla Taylor  Procedure(s) Performed: Procedure(s): DILATATION AND CURETTAGE /HYSTEROSCOPY (N/A)  Patient Location: PACU  Anesthesia Type:General  Level of Consciousness: awake, alert  and oriented  Airway & Oxygen Therapy: Patient Spontanous Breathing and Patient connected to nasal cannula oxygen  Post-op Assessment: Report given to RN and Post -op Vital signs reviewed and stable  Post vital signs: Reviewed and stable  Last Vitals:  Filed Vitals:   06/14/15 0843 06/14/15 0908  BP:  151/97  Pulse: 82   Temp: 37 C   Resp: 20     Complications: No apparent anesthesia complications

## 2015-06-14 NOTE — Discharge Instructions (Signed)
Dilation and Curettage or Vacuum Curettage, Care After Please remove nausea patch behind your ear within 72hrs of discharge.  Drink enough fluids to keep urine clear/light yellow. Make sure you urinate within 6hrs of discharge. Please do NOT take Motrin/Aleve/Advil until after 6:15pm today.  Refer to this sheet in the next few weeks. These instructions provide you with information on caring for yourself after your procedure. Your health care provider may also give you more specific instructions. Your treatment has been planned according to current medical practices, but problems sometimes occur. Call your health care provider if you have any problems or questions after your procedure. WHAT TO EXPECT AFTER THE PROCEDURE After your procedure, it is typical to have light cramping and bleeding. This may last for 2 days to 2 weeks after the procedure. HOME CARE INSTRUCTIONS   Do not drive for 24 hours.  Wait 1 week before returning to strenuous activities.  Take your temperature 2 times a day for 4 days and write it down. Provide these temperatures to your health care provider if you develop a fever.  Avoid long periods of standing.  Avoid heavy lifting, pushing, or pulling. Do not lift anything heavier than 10 pounds (4.5 kg).  Limit stair climbing to once or twice a day.  Take rest periods often.  You may resume your usual diet.  Drink enough fluids to keep your urine clear or pale yellow.  Your usual bowel function should return. If you have constipation, you may:  Take a mild laxative with permission from your health care provider.  Add fruit and bran to your diet.  Drink more fluids.  Take showers instead of baths until your health care provider gives you permission to take baths.  Do not go swimming or use a hot tub until your health care provider approves.  Try to have someone with you or available to you the first 24-48 hours, especially if you were given a general  anesthetic.  Do not douche, use tampons, or have sex (intercourse) for 2 weeks after the procedure.  Only take over-the-counter or prescription medicines as directed by your health care provider. Do not take aspirin. It can cause bleeding.  Follow up with your health care provider as directed. SEEK MEDICAL CARE IF:   You have increasing cramps or pain that is not relieved with medicine.  You have abdominal pain that does not seem to be related to the same area of earlier cramping and pain.  You have bad smelling vaginal discharge.  You have a rash.  You are having problems with any medicine. SEEK IMMEDIATE MEDICAL CARE IF:   You have bleeding that is heavier than a normal menstrual period.  You have a fever.  You have chest pain.  You have shortness of breath.  You feel dizzy or feel like fainting.  You pass out.  You have pain in your shoulder strap area.  You have heavy vaginal bleeding with or without blood clots. MAKE SURE YOU:   Understand these instructions.  Will watch your condition.  Will get help right away if you are not doing well or get worse.   This information is not intended to replace advice given to you by your health care provider. Make sure you discuss any questions you have with your health care provider.   Document Released: 02/24/2000 Document Revised: 03/03/2013 Document Reviewed: 09/25/2012 Elsevier Interactive Patient Education 2016 [place] Anesthesia, Adult, Care After Refer to this sheet in the next few  weeks. These instructions provide you with information on caring for yourself after your procedure. Your health care provider may also give you more specific instructions. Your treatment has been planned according to current medical practices, but problems sometimes occur. Call your health care provider if you have any problems or questions after your procedure. WHAT TO EXPECT AFTER THE PROCEDURE After the procedure, it is  typical to experience:  Sleepiness.  Nausea and vomiting. HOME CARE INSTRUCTIONS  For the first 24 hours after general anesthesia:  Have a responsible person with you.  Do not drive a car. If you are alone, do not take public transportation.  Do not drink alcohol.  Do not take medicine that has not been prescribed by your health care provider.  Do not sign important papers or make important decisions.  You may resume a normal diet and activities as directed by your health care provider.  Change bandages (dressings) as directed.  If you have questions or problems that seem related to general anesthesia, call the hospital and ask for the anesthetist or anesthesiologist on call. SEEK MEDICAL CARE IF:  You have nausea and vomiting that continue the day after anesthesia.  You develop a rash. SEEK IMMEDIATE MEDICAL CARE IF:   You have difficulty breathing.  You have chest pain.  You have any allergic problems.   This information is not intended to replace advice given to you by your health care provider. Make sure you discuss any questions you have with your health care provider.   Document Released: 06/04/2000 Document Revised: 03/19/2014 Document Reviewed: 06/27/2011 Elsevier Interactive Patient Education [employer].

## 2015-06-14 NOTE — Anesthesia Preprocedure Evaluation (Signed)
Anesthesia Evaluation  Patient identified by MRN, date of birth, ID band Patient awake    Reviewed: Allergy & Precautions, H&P , NPO status , Patient's Chart, lab work & pertinent test results  Airway Mallampati: II  TM Distance: >3 FB Neck ROM: full    Dental no notable dental hx. (+) Dental Advisory Given, Teeth Intact   Pulmonary neg pulmonary ROS,    Pulmonary exam normal breath sounds clear to auscultation       Cardiovascular Exercise Tolerance: Good Normal cardiovascular exam+ Valvular Problems/Murmurs MVP  Rhythm:regular Rate:Normal     Neuro/Psych negative neurological ROS  negative psych ROS   GI/Hepatic negative GI ROS, Neg liver ROS,   Endo/Other  negative endocrine ROSHypothyroidism   Renal/GU negative Renal ROS  negative genitourinary   Musculoskeletal   Abdominal   Peds  Hematology negative hematology ROS (+)   Anesthesia Other Findings   Reproductive/Obstetrics negative OB ROS                             Anesthesia Physical Anesthesia Plan  ASA: II  Anesthesia Plan: General   Post-op Pain Management:    Induction: Intravenous  Airway Management Planned: LMA  Additional Equipment:   Intra-op Plan:   Post-operative Plan:   Informed Consent: I have reviewed the patients History and Physical, chart, labs and discussed the procedure including the risks, benefits and alternatives for the proposed anesthesia with the patient or authorized representative who has indicated his/her understanding and acceptance.   Dental Advisory Given  Plan Discussed with: CRNA  Anesthesia Plan Comments:         Anesthesia Quick Evaluation

## 2015-06-15 ENCOUNTER — Encounter (HOSPITAL_COMMUNITY): Payer: Self-pay | Admitting: Obstetrics & Gynecology

## 2015-06-21 ENCOUNTER — Other Ambulatory Visit: Payer: Self-pay | Admitting: Family Medicine

## 2015-07-20 ENCOUNTER — Encounter: Payer: Self-pay | Admitting: Obstetrics & Gynecology

## 2015-07-20 ENCOUNTER — Ambulatory Visit (INDEPENDENT_AMBULATORY_CARE_PROVIDER_SITE_OTHER): Payer: Managed Care, Other (non HMO) | Admitting: Obstetrics & Gynecology

## 2015-07-20 VITALS — BP 135/82 | HR 72 | Resp 16 | Ht 67.0 in | Wt 208.0 lb

## 2015-07-20 DIAGNOSIS — Z01419 Encounter for gynecological examination (general) (routine) without abnormal findings: Secondary | ICD-10-CM

## 2015-07-20 DIAGNOSIS — Z124 Encounter for screening for malignant neoplasm of cervix: Secondary | ICD-10-CM

## 2015-07-20 DIAGNOSIS — Z9889 Other specified postprocedural states: Secondary | ICD-10-CM

## 2015-07-20 DIAGNOSIS — Z1151 Encounter for screening for human papillomavirus (HPV): Secondary | ICD-10-CM

## 2015-07-20 DIAGNOSIS — Z0000 Encounter for general adult medical examination without abnormal findings: Secondary | ICD-10-CM

## 2015-07-20 NOTE — Addendum Note (Signed)
Addended by: Emily Filbert on: 07/20/2015 09:12 AM   Modules accepted: Orders

## 2015-07-20 NOTE — Progress Notes (Signed)
   Subjective:    Patient ID: Kayla Taylor, female    DOB: 1960/06/30, 55 y.o.   MRN: [id number]  HPI 55 yo here for a 6 week postop visit after a d&c for PMB. Her pathology was normal. She had a little spotting for 2 days post op. Denies any problems, no cramping even after surgery. Went to work on POD#1.   Review of Systems Pap at an outside facility about 2 years ago. Normal.    Objective:   Physical Exam WNWHWFNAD, pleasant Breathing, conversing, and ambulating normally Abd- benign Vulva/vagina/cervix- healthy appearing NSSA, NT, no palpable adnexal masses       Assessment & Plan:  H/o PMB- negative pathology. Pap done today Mammogram ordered

## 2015-07-21 LAB — CYTOLOGY - PAP

## 2015-07-21 LAB — FOLLICLE STIMULATING HORMONE: FSH: 31.2 m[IU]/mL

## 2015-07-25 ENCOUNTER — Encounter: Payer: Self-pay | Admitting: Family Medicine

## 2015-07-25 ENCOUNTER — Telehealth: Payer: Self-pay

## 2015-07-25 ENCOUNTER — Ambulatory Visit (INDEPENDENT_AMBULATORY_CARE_PROVIDER_SITE_OTHER): Payer: Managed Care, Other (non HMO) | Admitting: Family Medicine

## 2015-07-25 VITALS — BP 147/86 | HR 86 | Wt 208.0 lb

## 2015-07-25 DIAGNOSIS — E038 Other specified hypothyroidism: Secondary | ICD-10-CM | POA: Diagnosis not present

## 2015-07-25 DIAGNOSIS — Z114 Encounter for screening for human immunodeficiency virus [HIV]: Secondary | ICD-10-CM

## 2015-07-25 DIAGNOSIS — R7301 Impaired fasting glucose: Secondary | ICD-10-CM

## 2015-07-25 DIAGNOSIS — Z1159 Encounter for screening for other viral diseases: Secondary | ICD-10-CM | POA: Diagnosis not present

## 2015-07-25 DIAGNOSIS — Z13220 Encounter for screening for lipoid disorders: Secondary | ICD-10-CM

## 2015-07-25 LAB — POCT GLYCOSYLATED HEMOGLOBIN (HGB A1C): HEMOGLOBIN A1C: 5.7

## 2015-07-25 MED ORDER — ESTRADIOL 2 MG PO TABS: ORAL_TABLET | ORAL | Status: DC

## 2015-07-25 MED ORDER — THYROID 48.75 MG PO TABS: 1.0000 | ORAL_TABLET | Freq: Two times a day (BID) | ORAL | Status: DC

## 2015-07-25 NOTE — Progress Notes (Signed)
   Subjective:    Patient ID: Kayla Taylor, female    DOB: 1960/05/09, 55 y.o.   MRN: [id number]  HPI IFG - No increased thirst urination.She has really been working on her diet and trying to lose weight. She's not been exercising regularly but says she plans to start walking after work with her sister who lives nearby.  Hypothyroidism-she's currently on nature thyroid 48.75 mg daily. She has lost 5 pounds in the last several months. No skin or hair changes. Happy with her current regimen.   Review of Systems     Objective:   Physical Exam  Constitutional: She is oriented to person, place, and time. She appears well-developed and well-nourished.  HENT:  Head: Normocephalic and atraumatic.  Cardiovascular: Normal rate, regular rhythm and normal heart sounds.   Pulmonary/Chest: Effort normal and breath sounds normal.  Neurological: She is alert and oriented to person, place, and time.  Skin: Skin is warm and dry.  Psychiatric: She has a normal mood and affect. Her behavior is normal.          Assessment & Plan:  Impaired fasting glucose-stable. A1c of 5.7 today. Every time she comes her A1c looks better and better which is absolutely fantastic. Congratulated her on recent lifestyle changes and struggling her encourage her to start really incorporating exercise especially walking with her sister after work would be perfect. Follow-up in 6 months.  Hypothyroidism- recheck levels.  Stable  Reminded her that she is due for her mammogram.

## 2015-07-25 NOTE — Telephone Encounter (Signed)
-----   Message from Emily Filbert, MD sent at 07/25/2015  2:23 PM EDT ----- Please let her know that she is officially in menopause. Thanks

## 2015-07-26 LAB — COMPLETE METABOLIC PANEL WITH GFR
ALBUMIN: 4 g/dL (ref 3.6–5.1)
ALK PHOS: 59 U/L (ref 33–130)
ALT: 27 U/L (ref 6–29)
AST: 20 U/L (ref 10–35)
BILIRUBIN TOTAL: 0.2 mg/dL (ref 0.2–1.2)
BUN: 10 mg/dL (ref 7–25)
CO2: 23 mmol/L (ref 20–31)
CREATININE: 0.95 mg/dL (ref 0.50–1.05)
Calcium: 8.8 mg/dL (ref 8.6–10.4)
Chloride: 106 mmol/L (ref 98–110)
GFR, EST AFRICAN AMERICAN: 80 mL/min (ref >=60)
GFR, Est Non African American: 69 mL/min (ref >=60)
GLUCOSE: 103 mg/dL — AB (ref 65–99)
Potassium: 4.2 mmol/L (ref 3.5–5.3)
SODIUM: 139 mmol/L (ref 135–146)
TOTAL PROTEIN: 6.5 g/dL (ref 6.1–8.1)

## 2015-07-26 LAB — LIPID PANEL
CHOL/HDL RATIO: 2.4 ratio (ref <=5.0)
Cholesterol: 147 mg/dL (ref 125–200)
HDL: 62 mg/dL (ref >=46)
LDL Cholesterol: 67 mg/dL (ref <130)
Triglycerides: 90 mg/dL (ref <150)
VLDL: 18 mg/dL (ref <30)

## 2015-07-26 NOTE — Telephone Encounter (Signed)
Left message for patient that she is in menopause and to call back if she has any further questions. Kathrene Alu RN BSN

## 2015-07-27 LAB — HIV ANTIBODY (ROUTINE TESTING W REFLEX): HIV: NONREACTIVE

## 2015-07-27 LAB — HEPATITIS C ANTIBODY: HCV Ab: NEGATIVE

## 2015-07-27 LAB — TSH: TSH: 3.31 m[IU]/L

## 2015-08-31 ENCOUNTER — Ambulatory Visit (INDEPENDENT_AMBULATORY_CARE_PROVIDER_SITE_OTHER): Payer: Managed Care, Other (non HMO)

## 2015-08-31 DIAGNOSIS — R928 Other abnormal and inconclusive findings on diagnostic imaging of breast: Secondary | ICD-10-CM | POA: Diagnosis not present

## 2015-08-31 DIAGNOSIS — Z0000 Encounter for general adult medical examination without abnormal findings: Secondary | ICD-10-CM

## 2015-09-05 ENCOUNTER — Other Ambulatory Visit (HOSPITAL_COMMUNITY): Payer: Self-pay | Admitting: Obstetrics & Gynecology

## 2015-09-05 ENCOUNTER — Other Ambulatory Visit: Payer: Self-pay

## 2015-09-05 DIAGNOSIS — R928 Other abnormal and inconclusive findings on diagnostic imaging of breast: Secondary | ICD-10-CM

## 2015-11-17 ENCOUNTER — Other Ambulatory Visit: Payer: Self-pay | Admitting: Family Medicine

## 2015-12-21 ENCOUNTER — Telehealth: Payer: Self-pay

## 2015-12-21 NOTE — Telephone Encounter (Signed)
Is is all or just the dose she is on that they are out of?

## 2015-12-21 NOTE — Telephone Encounter (Signed)
[facility] called reporting that the nature thyroid medication is on back order. They are not sure when they'll get it.  Is there something else you would like to Rx?

## 2015-12-22 ENCOUNTER — Other Ambulatory Visit: Payer: Self-pay

## 2015-12-22 NOTE — Telephone Encounter (Signed)
Ok we can do a 30mg  and a 15mg  together.  She may have to pay 2 copays. Call pt and make sure she is ok with that.  Then ok to send rx for both doses for once a day.

## 2015-12-22 NOTE — Telephone Encounter (Signed)
Left vm for pt to return call to clinic  

## 2015-12-22 NOTE — Telephone Encounter (Signed)
Pt is ok with [place] sending the 2 different doses.  She is hopeful that next month they'll have her dose back in stock.

## 2015-12-22 NOTE — Telephone Encounter (Signed)
They have the 15 mg 30 mg and 90 mg.

## 2016-01-27 ENCOUNTER — Encounter: Payer: Self-pay | Admitting: Family Medicine

## 2016-01-27 ENCOUNTER — Ambulatory Visit (INDEPENDENT_AMBULATORY_CARE_PROVIDER_SITE_OTHER): Payer: Managed Care, Other (non HMO) | Admitting: Family Medicine

## 2016-01-27 VITALS — BP 144/84 | HR 79 | Wt 207.0 lb

## 2016-01-27 DIAGNOSIS — F4321 Adjustment disorder with depressed mood: Secondary | ICD-10-CM

## 2016-01-27 DIAGNOSIS — R030 Elevated blood-pressure reading, without diagnosis of hypertension: Secondary | ICD-10-CM | POA: Diagnosis not present

## 2016-01-27 DIAGNOSIS — R7301 Impaired fasting glucose: Secondary | ICD-10-CM

## 2016-01-27 DIAGNOSIS — F4320 Adjustment disorder, unspecified: Secondary | ICD-10-CM

## 2016-01-27 LAB — POCT GLYCOSYLATED HEMOGLOBIN (HGB A1C): HEMOGLOBIN A1C: 5.6

## 2016-01-27 NOTE — Progress Notes (Signed)
Subjective:    CC: IFG -   HPI:  IFG - No inc thrist or urination. Asymptomatic. She admits she has not been exercising regularly and has not been doing great with her diet. Her mother has been very ill for the last several months.  Lab Results  Component Value Date   HGBA1C 5.7 07/31/2015   Her mother passed away about a month ago.  She has been under some more stress.  She is doing well though. Her mother was very ill over the last 6 months. She has 2 sisters who are supportive.   Past medical history, Surgical history, Family history not pertinant except as noted below, Social history, Allergies, and medications have been entered into the medical record, reviewed, and corrections made.   Review of Systems: No fevers, chills, night sweats, weight loss, chest pain, or shortness of breath.   Objective:    General: Well Developed, well nourished, and in no acute distress.  Neuro: Alert and oriented x3, extra-ocular muscles intact, sensation grossly intact.  HEENT: Normocephalic, atraumatic  Skin: Warm and dry, no rashes. Cardiac: Regular rate and rhythm, no murmurs rubs or gallops, no lower extremity edema.  Respiratory: Clear to auscultation bilaterally. Not using accessory muscles, speaking in full sentences.   Impression and Recommendations:   IFG - Stable. A1C 5.6. Encouraged her to get back on track with diet and exercise and taking care of herself.  Elevated BP  - Blood pressure was quite elevated today on exam. She had been taking some cold medicine over the last couple of days. Encouraged her to come back in 1-2 weeks for nurse blood pressure check.  Grieving-offered to refer her to a therapist or counselor. She can also check into [facility] counseling through hospice which is free. She seems like she is doing okay.

## 2016-01-31 ENCOUNTER — Other Ambulatory Visit: Payer: Self-pay | Admitting: Family Medicine

## 2016-02-10 ENCOUNTER — Ambulatory Visit (INDEPENDENT_AMBULATORY_CARE_PROVIDER_SITE_OTHER): Payer: Managed Care, Other (non HMO) | Admitting: Family Medicine

## 2016-02-10 DIAGNOSIS — I10 Essential (primary) hypertension: Secondary | ICD-10-CM

## 2016-02-10 MED ORDER — LISINOPRIL 10 MG PO TABS: 10.0000 mg | ORAL_TABLET | Freq: Every day | ORAL | 0 refills | Status: DC

## 2016-02-10 NOTE — Progress Notes (Signed)
HTN - New DX. BP has been high on several occassions.  Recommend that we start lisinopril 10 mg daily. She will need a repeat BMP in about 1-2 weeks after starting the medication. However follow-up in 1-2 weeks for repeat blood pressure check.  Kayla Lecher, MD

## 2016-02-10 NOTE — Progress Notes (Signed)
Pt is here for a BP check. Denies dizziness, headache, and blurred vision. Pt bp was elevated today. Will route to PCP for further review.

## 2016-02-13 NOTE — Progress Notes (Signed)
Recommendations left on vm 

## 2016-03-01 ENCOUNTER — Encounter: Payer: Self-pay | Admitting: Family Medicine

## 2016-03-01 ENCOUNTER — Ambulatory Visit (INDEPENDENT_AMBULATORY_CARE_PROVIDER_SITE_OTHER): Payer: Managed Care, Other (non HMO) | Admitting: Family Medicine

## 2016-03-01 VITALS — BP 128/78 | HR 69 | Ht 67.0 in | Wt 205.0 lb

## 2016-03-01 DIAGNOSIS — I10 Essential (primary) hypertension: Secondary | ICD-10-CM | POA: Diagnosis not present

## 2016-03-01 DIAGNOSIS — R928 Other abnormal and inconclusive findings on diagnostic imaging of breast: Secondary | ICD-10-CM

## 2016-03-01 LAB — BASIC METABOLIC PANEL WITH GFR
BUN: 10 mg/dL (ref 7–25)
CALCIUM: 9.2 mg/dL (ref 8.6–10.4)
CO2: 20 mmol/L (ref 20–31)
CREATININE: 0.86 mg/dL (ref 0.50–1.05)
Chloride: 106 mmol/L (ref 98–110)
GFR, EST AFRICAN AMERICAN: 89 mL/min (ref >=60)
GFR, EST NON AFRICAN AMERICAN: 77 mL/min (ref >=60)
GLUCOSE: 97 mg/dL (ref 65–99)
Potassium: 4.4 mmol/L (ref 3.5–5.3)
Sodium: 140 mmol/L (ref 135–146)

## 2016-03-01 MED ORDER — LISINOPRIL 10 MG PO TABS: 10.0000 mg | ORAL_TABLET | Freq: Every day | ORAL | 0 refills | Status: DC

## 2016-03-01 NOTE — Progress Notes (Signed)
Subjective:    CC: HTN  HPI:  Hypertension-fairly new diagnosis. Is been high on several occasions so about 8 weeks ago we decided to start lisinopril 10 mg daily.  Says she got a recall on her mammogram.  She hadn't done in June but has not gone back for follow-up examination. She was particularly worried about the cost.  Past medical history, Surgical history, Family history not pertinant except as noted below, Social history, Allergies, and medications have been entered into the medical record, reviewed, and corrections made.   Review of Systems: No fevers, chills, night sweats, weight loss, chest pain, or shortness of breath.   Objective:    General: Well Developed, well nourished, and in no acute distress.  Neuro: Alert and oriented x3, extra-ocular muscles intact, sensation grossly intact.  HEENT: Normocephalic, atraumatic  Skin: Warm and dry, no rashes. Cardiac: Regular rate and rhythm, no murmurs rubs or gallops, no lower extremity edema.  Respiratory: Clear to auscultation bilaterally. Not using accessory muscles, speaking in full sentences.   Impression and Recommendations:    HTN - Well controlled. Continue current regimen. Follow up in  6 mo. Check BMP.   Abnormal mammogram-did encourage her to go and have the actual compression view and ultrasound on the left breast just to be thorough. She can certainly call their office and see what potential cost would be since she's very concerned about this.

## 2016-03-02 ENCOUNTER — Other Ambulatory Visit: Payer: Self-pay | Admitting: Family Medicine

## 2016-03-20 ENCOUNTER — Encounter: Payer: Self-pay | Admitting: Family Medicine

## 2016-03-20 MED ORDER — LOSARTAN POTASSIUM 25 MG PO TABS: 25.0000 mg | ORAL_TABLET | Freq: Every day | ORAL | 1 refills | Status: DC

## 2016-05-03 ENCOUNTER — Ambulatory Visit (INDEPENDENT_AMBULATORY_CARE_PROVIDER_SITE_OTHER): Payer: Managed Care, Other (non HMO) | Admitting: Obstetrics & Gynecology

## 2016-05-03 ENCOUNTER — Encounter: Payer: Self-pay | Admitting: Obstetrics & Gynecology

## 2016-05-03 VITALS — BP 172/91 | HR 81 | Resp 16 | Ht 67.0 in | Wt 202.0 lb

## 2016-05-03 DIAGNOSIS — N950 Postmenopausal bleeding: Secondary | ICD-10-CM | POA: Diagnosis not present

## 2016-05-03 MED ORDER — MISOPROSTOL 200 MCG PO TABS: 200.0000 ug | ORAL_TABLET | Freq: Four times a day (QID) | ORAL | 0 refills | Status: DC

## 2016-05-03 NOTE — Progress Notes (Signed)
   Subjective:    Patient ID: Kayla Taylor, female    DOB: [birth year], 56 y.o.   MRN: [id number]  HPI 56 yo SW G0 here with a recurrence of PMB. I did a d&c and H/S about a year ago with an atrophic uterine lining noted. Her pathology was normal. She started bleeding again Sept. She bleeds "sporadically", sees some every month.  She officially went through menopause about 2+ years ago.   Review of Systems  New sexually partner, since the last week. She is using condoms.     Objective:   Physical Exam WNWWHWFNAD Breathing, conversing, and ambulating normally      Assessment & Plan:  PMB bleeding- recheck [facility] and gyn u/s. If [place] is still elevated, then repeat EMBX.

## 2016-05-04 LAB — FOLLICLE STIMULATING HORMONE: FSH: 40.9 m[IU]/mL

## 2016-05-07 ENCOUNTER — Telehealth: Payer: Self-pay | Admitting: *Deleted

## 2016-05-07 NOTE — Telephone Encounter (Signed)
-----   Message from Emily Filbert, MD sent at 05/07/2016  2:27 PM EST ----- She is menopausal!

## 2016-05-07 NOTE — Telephone Encounter (Signed)
LM on voicemail of lab results.  Pt is menopausal and let her know if she had any questions to call the office.

## 2016-05-09 ENCOUNTER — Ambulatory Visit (INDEPENDENT_AMBULATORY_CARE_PROVIDER_SITE_OTHER): Payer: Managed Care, Other (non HMO)

## 2016-05-09 DIAGNOSIS — D259 Leiomyoma of uterus, unspecified: Secondary | ICD-10-CM | POA: Diagnosis not present

## 2016-05-09 DIAGNOSIS — N950 Postmenopausal bleeding: Secondary | ICD-10-CM

## 2016-05-14 ENCOUNTER — Telehealth: Payer: Self-pay | Admitting: *Deleted

## 2016-05-14 DIAGNOSIS — N950 Postmenopausal bleeding: Secondary | ICD-10-CM

## 2016-05-14 MED ORDER — MISOPROSTOL 200 MCG PO TABS: ORAL_TABLET | ORAL | 0 refills | Status: DC

## 2016-05-14 NOTE — Telephone Encounter (Signed)
Pt notified of appt for Endometrial biopsy and RX for Cytotec was sent to her pharmacy

## 2016-05-14 NOTE — Telephone Encounter (Signed)
-----   Message from Emily Filbert, MD sent at 05/11/2016  9:30 AM EST ----- Because she is postmenopausal, I will need to repeat the [facility]. She should get a prescription for cytotec PO 600 mcg the night prior to the biopsy. Thanks

## 2016-05-24 ENCOUNTER — Ambulatory Visit (INDEPENDENT_AMBULATORY_CARE_PROVIDER_SITE_OTHER): Payer: Managed Care, Other (non HMO) | Admitting: Obstetrics & Gynecology

## 2016-05-24 ENCOUNTER — Encounter: Payer: Self-pay | Admitting: Obstetrics & Gynecology

## 2016-05-24 VITALS — BP 157/97 | HR 69 | Resp 16 | Ht 67.0 in | Wt 202.0 lb

## 2016-05-24 DIAGNOSIS — N950 Postmenopausal bleeding: Secondary | ICD-10-CM | POA: Diagnosis not present

## 2016-05-24 DIAGNOSIS — Z3202 Encounter for pregnancy test, result negative: Secondary | ICD-10-CM | POA: Diagnosis not present

## 2016-05-24 LAB — POCT URINE PREGNANCY: PREG TEST UR: NEGATIVE

## 2016-05-24 NOTE — Progress Notes (Signed)
   Subjective:    Patient ID: Kayla Taylor, female    DOB: 1961-01-17, 56 y.o.   MRN: [id number]  HPI 56 yo lady with PMB here for a EMBX. She took cytotec last night. She had a hysteroscopy and d&c last year for this same problem. A recent [place] was in the postmenopausal range.   Review of Systems     Objective:   Physical Exam  UPT negative, consent signed, time out done Cervix prepped with betadine and grasped with a single tooth tenaculum Uterus sounded to 9 cm Pipelle used for 2 passes with a small to moderate amount of tissue obtained. She tolerated the procedure well.     Assessment & Plan:  PMB- await pathology If negative, then I have offered her a endometrial ablation

## 2016-06-09 ENCOUNTER — Other Ambulatory Visit: Payer: Self-pay | Admitting: Family Medicine

## 2016-06-14 ENCOUNTER — Telehealth: Payer: Self-pay | Admitting: *Deleted

## 2016-06-14 ENCOUNTER — Encounter (HOSPITAL_COMMUNITY): Payer: Self-pay

## 2016-06-14 NOTE — Telephone Encounter (Signed)
-----   Message from Francia Greaves sent at 06/14/2016  2:42 PM EDT ----- Regarding: surgery Done. Posted for 08/07/2016 @1300  w/ Hulan Fray CPT 59136 ----- Message ----- From: Emily Filbert, MD Sent: 06/14/2016  12:59 PM To: [place]  Can you please schedule her for a HTA endometrial ablation? Thanks

## 2016-06-14 NOTE — Telephone Encounter (Signed)
Pt notified of HTA scheduled for 08/07/16 @ 1:00 with Dr Hulan Fray.

## 2016-07-13 ENCOUNTER — Other Ambulatory Visit: Payer: Self-pay | Admitting: Family Medicine

## 2016-07-26 NOTE — Patient Instructions (Addendum)
Your procedure is scheduled on:  Tuesday, 5/29  Enter through the Main Entrance of [facility] at: 11:30 am  Pick up the phone at the desk and dial [phone].  Call this number if you have problems the morning of surgery: [phone].  Remember: Do NOT eat food after midnight Monday  Do NOT drink clear liquids after 7 am Tuesday, day of surgery  Take these medicines the morning of surgery with a SIP OF WATER:  Armour, zyrtec, extrace   Do NOT wear jewelry (body piercing), metal hair clips/bobby pins, make-up, or nail polish. Do NOT wear lotions, powders, or perfumes.  You may wear deoderant. Do NOT shave for 48 hours prior to surgery. Do NOT bring valuables to the hospital.  Have a responsible adult drive you home and stay with you for 24 hours after your procedure.  Home with [facility] cell [phone].

## 2016-07-27 ENCOUNTER — Encounter (HOSPITAL_COMMUNITY): Payer: Self-pay

## 2016-07-27 ENCOUNTER — Encounter (HOSPITAL_COMMUNITY)
Admission: RE | Admit: 2016-07-27 | Discharge: 2016-07-27 | Disposition: A | Discharge status: Home / Self Care | Payer: Managed Care, Other (non HMO) | Source: Ambulatory Visit | Attending: Obstetrics & Gynecology | Admitting: Obstetrics & Gynecology

## 2016-07-27 ENCOUNTER — Other Ambulatory Visit: Payer: Self-pay

## 2016-07-27 DIAGNOSIS — Z01810 Encounter for preprocedural cardiovascular examination: Secondary | ICD-10-CM | POA: Insufficient documentation

## 2016-07-27 DIAGNOSIS — N950 Postmenopausal bleeding: Secondary | ICD-10-CM | POA: Diagnosis not present

## 2016-07-27 DIAGNOSIS — Z01812 Encounter for preprocedural laboratory examination: Secondary | ICD-10-CM | POA: Diagnosis not present

## 2016-07-27 HISTORY — DX: Essential (primary) hypertension: I10

## 2016-07-27 HISTORY — DX: Prediabetes: R73.03

## 2016-07-27 LAB — BASIC METABOLIC PANEL
ANION GAP: 8 (ref 5–15)
BUN: 9 mg/dL (ref 6–20)
CALCIUM: 9.5 mg/dL (ref 8.9–10.3)
CO2: 26 mmol/L (ref 22–32)
Chloride: 103 mmol/L (ref 101–111)
Creatinine, Ser: 0.91 mg/dL (ref 0.44–1.00)
GFR calc Af Amer: >60 mL/min (ref >60)
GFR calc non Af Amer: >60 mL/min (ref >60)
GLUCOSE: 89 mg/dL (ref 65–99)
Potassium: 3.7 mmol/L (ref 3.5–5.1)
Sodium: 137 mmol/L (ref 135–145)

## 2016-07-27 LAB — CBC
HEMATOCRIT: 38.4 % (ref 36.0–46.0)
HEMOGLOBIN: 13.2 g/dL (ref 12.0–15.0)
MCH: 31.4 pg (ref 26.0–34.0)
MCHC: 34.4 g/dL (ref 30.0–36.0)
MCV: 91.4 fL (ref 78.0–100.0)
Platelets: 281 10*3/uL (ref 150–400)
RBC: 4.2 MIL/uL (ref 3.87–5.11)
RDW: 13.6 % (ref 11.5–15.5)
WBC: 6.2 10*3/uL (ref 4.0–10.5)

## 2016-07-27 NOTE — Pre-Procedure Instructions (Signed)
Reviewed medications/medical history/EKG with Dr. Marcell Barlow.  [place] for surgery.  No orders given.

## 2016-08-02 ENCOUNTER — Telehealth: Payer: Self-pay

## 2016-08-02 NOTE — Telephone Encounter (Signed)
Called Kayla Taylor [phone] to get prior auth for surgery [phone]. No prior auth needed for surgery.

## 2016-08-07 ENCOUNTER — Ambulatory Visit (HOSPITAL_COMMUNITY)
Admission: RE | Admit: 2016-08-07 | Discharge: 2016-08-07 | Disposition: A | Discharge status: Home / Self Care | Payer: Managed Care, Other (non HMO) | Source: Ambulatory Visit | Attending: Obstetrics & Gynecology | Admitting: Obstetrics & Gynecology

## 2016-08-07 ENCOUNTER — Ambulatory Visit (HOSPITAL_COMMUNITY): Payer: Managed Care, Other (non HMO) | Admitting: Anesthesiology

## 2016-08-07 ENCOUNTER — Encounter (HOSPITAL_COMMUNITY): Payer: Self-pay | Admitting: Anesthesiology

## 2016-08-07 ENCOUNTER — Encounter (HOSPITAL_COMMUNITY)
Admission: RE | Disposition: A | Discharge status: Home / Self Care | Payer: Self-pay | Source: Ambulatory Visit | Attending: Obstetrics & Gynecology

## 2016-08-07 DIAGNOSIS — Z885 Allergy status to narcotic agent status: Secondary | ICD-10-CM | POA: Insufficient documentation

## 2016-08-07 DIAGNOSIS — I341 Nonrheumatic mitral (valve) prolapse: Secondary | ICD-10-CM | POA: Insufficient documentation

## 2016-08-07 DIAGNOSIS — N938 Other specified abnormal uterine and vaginal bleeding: Secondary | ICD-10-CM | POA: Diagnosis not present

## 2016-08-07 DIAGNOSIS — E039 Hypothyroidism, unspecified: Secondary | ICD-10-CM | POA: Insufficient documentation

## 2016-08-07 DIAGNOSIS — Z818 Family history of other mental and behavioral disorders: Secondary | ICD-10-CM | POA: Diagnosis not present

## 2016-08-07 DIAGNOSIS — R011 Cardiac murmur, unspecified: Secondary | ICD-10-CM | POA: Insufficient documentation

## 2016-08-07 DIAGNOSIS — Z6833 Body mass index (BMI) 33.0-33.9, adult: Secondary | ICD-10-CM | POA: Diagnosis not present

## 2016-08-07 DIAGNOSIS — Z803 Family history of malignant neoplasm of breast: Secondary | ICD-10-CM | POA: Insufficient documentation

## 2016-08-07 DIAGNOSIS — I10 Essential (primary) hypertension: Secondary | ICD-10-CM | POA: Insufficient documentation

## 2016-08-07 DIAGNOSIS — R7303 Prediabetes: Secondary | ICD-10-CM | POA: Insufficient documentation

## 2016-08-07 DIAGNOSIS — Z79899 Other long term (current) drug therapy: Secondary | ICD-10-CM | POA: Diagnosis not present

## 2016-08-07 DIAGNOSIS — Z882 Allergy status to sulfonamides status: Secondary | ICD-10-CM | POA: Diagnosis not present

## 2016-08-07 DIAGNOSIS — M419 Scoliosis, unspecified: Secondary | ICD-10-CM | POA: Insufficient documentation

## 2016-08-07 DIAGNOSIS — Z833 Family history of diabetes mellitus: Secondary | ICD-10-CM | POA: Diagnosis not present

## 2016-08-07 DIAGNOSIS — Z8249 Family history of ischemic heart disease and other diseases of the circulatory system: Secondary | ICD-10-CM | POA: Insufficient documentation

## 2016-08-07 HISTORY — PX: HYSTEROSCOPY: SHX211

## 2016-08-07 LAB — PREGNANCY, URINE: PREG TEST UR: NEGATIVE

## 2016-08-07 SURGERY — ABLATION, ENDOMETRIUM, HYSTEROSCOPIC
Anesthesia: General | Site: Vagina

## 2016-08-07 MED ORDER — MIDAZOLAM HCL 2 MG/2ML IJ SOLN: 0.5000 mg | Freq: Once | INTRAMUSCULAR | Status: DC | PRN

## 2016-08-07 MED ORDER — SUGAMMADEX SODIUM 200 MG/2ML IV SOLN
INTRAVENOUS | Status: AC
Start: 2016-08-07 — End: ?
  Filled 2016-08-07: qty 2

## 2016-08-07 MED ORDER — LACTATED RINGERS IV SOLN
INTRAVENOUS | Status: DC
  Administered 2016-08-07 (×2): via INTRAVENOUS

## 2016-08-07 MED ORDER — MIDAZOLAM HCL 2 MG/2ML IJ SOLN
INTRAMUSCULAR | Status: DC | PRN
  Administered 2016-08-07: 1 mg via INTRAVENOUS

## 2016-08-07 MED ORDER — PROMETHAZINE HCL 25 MG/ML IJ SOLN: 6.2500 mg | INTRAMUSCULAR | Status: DC | PRN

## 2016-08-07 MED ORDER — SCOPOLAMINE 1 MG/3DAYS TD PT72
MEDICATED_PATCH | TRANSDERMAL | Status: DC
Start: 2016-08-07 — End: 2016-08-07
  Administered 2016-08-07: 1.5 mg via TRANSDERMAL
  Filled 2016-08-07: qty 1

## 2016-08-07 MED ORDER — BUPIVACAINE HCL (PF) 0.5 % IJ SOLN
INTRAMUSCULAR | Status: AC
  Filled 2016-08-07: qty 30

## 2016-08-07 MED ORDER — LIDOCAINE HCL (CARDIAC) 20 MG/ML IV SOLN
INTRAVENOUS | Status: DC | PRN
  Administered 2016-08-07: 30 mg via INTRAVENOUS
  Administered 2016-08-07: 70 mg via INTRAVENOUS

## 2016-08-07 MED ORDER — SCOPOLAMINE 1 MG/3DAYS TD PT72
1.0000 | MEDICATED_PATCH | Freq: Once | TRANSDERMAL | Status: DC
  Administered 2016-08-07: 1.5 mg via TRANSDERMAL

## 2016-08-07 MED ORDER — PROPOFOL 10 MG/ML IV BOLUS
INTRAVENOUS | Status: DC | PRN
  Administered 2016-08-07: 180 mg via INTRAVENOUS

## 2016-08-07 MED ORDER — FENTANYL CITRATE (PF) 100 MCG/2ML IJ SOLN
INTRAMUSCULAR | Status: DC | PRN
  Administered 2016-08-07 (×2): 50 ug via INTRAVENOUS

## 2016-08-07 MED ORDER — KETOROLAC TROMETHAMINE 30 MG/ML IJ SOLN
INTRAMUSCULAR | Status: AC
  Filled 2016-08-07: qty 1

## 2016-08-07 MED ORDER — ONDANSETRON HCL 4 MG/2ML IJ SOLN
INTRAMUSCULAR | Status: DC | PRN
  Administered 2016-08-07: 4 mg via INTRAVENOUS

## 2016-08-07 MED ORDER — LIDOCAINE HCL (CARDIAC) 20 MG/ML IV SOLN
INTRAVENOUS | Status: AC
  Filled 2016-08-07: qty 5

## 2016-08-07 MED ORDER — FENTANYL CITRATE (PF) 100 MCG/2ML IJ SOLN: 25.0000 ug | INTRAMUSCULAR | Status: DC | PRN

## 2016-08-07 MED ORDER — FENTANYL CITRATE (PF) 100 MCG/2ML IJ SOLN
INTRAMUSCULAR | Status: AC
  Filled 2016-08-07: qty 2

## 2016-08-07 MED ORDER — PROPOFOL 10 MG/ML IV BOLUS
INTRAVENOUS | Status: AC
Start: 2016-08-07 — End: ?
  Filled 2016-08-07: qty 20

## 2016-08-07 MED ORDER — ONDANSETRON HCL 4 MG/2ML IJ SOLN
INTRAMUSCULAR | Status: AC
  Filled 2016-08-07: qty 2

## 2016-08-07 MED ORDER — BUPIVACAINE HCL (PF) 0.5 % IJ SOLN
INTRAMUSCULAR | Status: DC | PRN
  Administered 2016-08-07: 30 mL

## 2016-08-07 MED ORDER — SODIUM CHLORIDE 0.9 % IR SOLN
Status: DC | PRN
  Administered 2016-08-07: 3000 mL

## 2016-08-07 MED ORDER — KETOROLAC TROMETHAMINE 30 MG/ML IJ SOLN
INTRAMUSCULAR | Status: DC | PRN
  Administered 2016-08-07: 30 mg via INTRAVENOUS

## 2016-08-07 MED ORDER — MIDAZOLAM HCL 2 MG/2ML IJ SOLN
INTRAMUSCULAR | Status: AC
  Filled 2016-08-07: qty 2

## 2016-08-07 MED ORDER — DEXAMETHASONE SODIUM PHOSPHATE 4 MG/ML IJ SOLN
INTRAMUSCULAR | Status: AC
  Filled 2016-08-07: qty 1

## 2016-08-07 MED ORDER — MEPERIDINE HCL 25 MG/ML IJ SOLN: 6.2500 mg | INTRAMUSCULAR | Status: DC | PRN

## 2016-08-07 MED ORDER — DEXAMETHASONE SODIUM PHOSPHATE 10 MG/ML IJ SOLN
INTRAMUSCULAR | Status: DC | PRN
Start: 2016-08-07 — End: 2016-08-07
  Administered 2016-08-07: 4 mg via INTRAVENOUS

## 2016-08-07 SURGICAL SUPPLY — 17 items
CANISTER SUCT 3000ML PPV (MISCELLANEOUS) IMPLANT
CATH ROBINSON RED A/P 16FR (CATHETERS) IMPLANT
CLOTH BEACON ORANGE TIMEOUT ST (SAFETY) ×3 IMPLANT
CONTAINER PREFILL 10% NBF 60ML (FORM) ×3 IMPLANT
DILATOR CANAL MILEX (MISCELLANEOUS) IMPLANT
ELECT REM PT RETURN 9FT ADLT (ELECTROSURGICAL)
ELECTRODE REM PT RTRN 9FT ADLT (ELECTROSURGICAL) IMPLANT
GLOVE BIO SURGEON STRL SZ 6.5 (GLOVE) ×2 IMPLANT
GLOVE BIO SURGEONS STRL SZ 6.5 (GLOVE) ×1
GLOVE BIOGEL PI IND STRL 7.0 (GLOVE) ×1 IMPLANT
GLOVE BIOGEL PI INDICATOR 7.0 (GLOVE) ×2
GOWN STRL REUS W/TWL LRG LVL3 (GOWN DISPOSABLE) ×9 IMPLANT
PACK VAGINAL MINOR WOMEN LF (CUSTOM PROCEDURE TRAY) ×3 IMPLANT
PAD OB MATERNITY 4.3X12.25 (PERSONAL CARE ITEMS) ×3 IMPLANT
SET GENESYS HTA PROCERVA (MISCELLANEOUS) ×3 IMPLANT
SUT VIC AB 2-0 CT1 (SUTURE) ×3 IMPLANT
TOWEL OR 17X24 6PK STRL BLUE (TOWEL DISPOSABLE) ×6 IMPLANT

## 2016-08-07 NOTE — Anesthesia Postprocedure Evaluation (Signed)
Anesthesia Post Note  Patient: Liv Boal  Procedure(s) Performed: Procedure(s) (LRB): HYSTEROSCOPY WITH HYDROTHERMAL ABLATION (N/A)  Patient location during evaluation: PACU Anesthesia Type: General Level of consciousness: awake and alert, patient cooperative and oriented Pain management: pain level controlled Vital Signs Assessment: post-procedure vital signs reviewed and stable Respiratory status: spontaneous breathing, nonlabored ventilation and respiratory function stable Cardiovascular status: blood pressure returned to baseline and stable Postop Assessment: no signs of nausea or vomiting Anesthetic complications: no        Last Vitals:  Vitals:   08/07/16 1153 08/07/16 1345  BP: 120/84 (!) 146/89  Pulse: 72 (!) 54  Resp: 18 13  Temp: 36.7 C 36.6 C    Last Pain:  Vitals:   08/07/16 1153  TempSrc: Oral   Pain Goal: Patients Stated Pain Goal: 3 (08/07/16 1153)               Tempie Gibeault,E. Jayvier Burgher

## 2016-08-07 NOTE — Op Note (Signed)
08/07/2016  1:34 PM  PATIENT:  Kayla Taylor  56 y.o. female  PRE-OPERATIVE DIAGNOSIS:  Dysfunctional Uterine Bleeding  POST-OPERATIVE DIAGNOSIS:  Dysfunctional Uterine Bleeding  PROCEDURE:  Procedure(s): HYSTEROSCOPY WITH HYDROTHERMAL ABLATION (N/A)  DILATION AND CURETTAGE  SURGEON:  Surgeon(s) and Role:    * Arshiya Jakes C, MD   ANESTHESIA:   local and general  EBL:  Total I/O In: 1200 [I.V.:1200] Out: 10 [Blood:10]  BLOOD ADMINISTERED:none  DRAINS: none   LOCAL MEDICATIONS USED:  MARCAINE     SPECIMEN:  Source of Specimen:  uterine curettings  DISPOSITION OF SPECIMEN:  PATHOLOGY  COUNTS:  YES  TOURNIQUET:  * No tourniquets in log *  DICTATION: .Dragon Dictation  PLAN OF CARE: Discharge to home after PACU  PATIENT DISPOSITION:  PACU - hemodynamically stable.   Delay start of Pharmacological VTE agent (>24hrs) due to surgical blood loss or risk of bleeding: not applicable    The risks, benefits, and alternatives of surgery were explained, understood, and accepted. All questions were answered. Consents were signed. In the operating room general anesthesia was applied without complication, and she was placed in the dorsal lithotomy position. Her vagina was prepped and draped in the usual sterile fashion. A bimanual exam revealed a  NSSA  mobile uterus. Her adnexa were nonenlarged. A speculum was placed and a single-tooth tenaculum was used to grasp the anterior lip of her cervix. A total of 30 mL of 0.5% Marcaine was used to perform a paracervical block. Her uterus sounded to 10 cm. Her cervix was carefully and slowly dilated to accommodate a small curette. A curettage was done in all quadrants and the fundus of the uterus. A small amount of tissue was obtained.  A gritty sensation was appreciated throughout. Her cervix was dilated to 61 [place]. Hysteroscopy revealed a normal endometrium. The ostia were visualized. The HTA device passed its test and ran for the regular  amount of time. After the removal of the tenaculum, I noted some bleeding from the anterior lip. I placed a figure of 8 2-0 vicryl suture there. Excellent hemostasis was noted.  She was taken to the recovery room after being extubated. She tolerated the procedure well.

## 2016-08-07 NOTE — Transfer of Care (Signed)
Immediate Anesthesia Transfer of Care Note  Patient: Kayla Taylor  Procedure(s) Performed: Procedure(s): HYSTEROSCOPY WITH HYDROTHERMAL ABLATION (N/A)  Patient Location: PACU  Anesthesia Type:General  Level of Consciousness: awake, alert , oriented and patient cooperative  Airway & Oxygen Therapy: Patient Spontanous Breathing and Patient connected to nasal cannula oxygen  Post-op Assessment: Report given to RN and Post -op Vital signs reviewed and stable  Post vital signs: Reviewed and stable  Last Vitals:  Vitals:   08/07/16 1153  BP: 120/84  Pulse: 72  Resp: 18  Temp: 36.7 C    Last Pain:  Vitals:   08/07/16 1153  TempSrc: Oral      Patients Stated Pain Goal: 3 (57/90/38 3338)  Complications: No apparent anesthesia complications

## 2016-08-07 NOTE — Anesthesia Preprocedure Evaluation (Signed)
Anesthesia Evaluation  Patient identified by MRN, date of birth, ID band Patient awake    Reviewed: Allergy & Precautions, NPO status , Patient's Chart, lab work & pertinent test results  History of Anesthesia Complications Negative for: history of anesthetic complications  Airway Mallampati: II  TM Distance: >3 FB Neck ROM: Full    Dental  (+) Dental Advisory Given   Pulmonary neg pulmonary ROS,    breath sounds clear to auscultation       Cardiovascular hypertension (no meds needed), + Valvular Problems/Murmurs (? MVP)  Rhythm:Regular Rate:Normal     Neuro/Psych negative neurological ROS     GI/Hepatic negative GI ROS, Neg liver ROS,   Endo/Other  Hypothyroidism Morbid obesity  Renal/GU negative Renal ROS     Musculoskeletal   Abdominal (+) + obese,   Peds  Hematology negative hematology ROS (+)   Anesthesia Other Findings   Reproductive/Obstetrics                             Anesthesia Physical Anesthesia Plan  ASA: II  Anesthesia Plan: General   Post-op Pain Management:    Induction: Intravenous  Airway Management Planned: LMA  Additional Equipment:   Intra-op Plan:   Post-operative Plan:   Informed Consent: I have reviewed the patients History and Physical, chart, labs and discussed the procedure including the risks, benefits and alternatives for the proposed anesthesia with the patient or authorized representative who has indicated his/her understanding and acceptance.   Dental advisory given  Plan Discussed with: CRNA and Surgeon  Anesthesia Plan Comments: (Plan routine monitors, GA- LMA OK)        Anesthesia Quick Evaluation

## 2016-08-07 NOTE — Discharge Instructions (Signed)
DISCHARGE INSTRUCTIONS: D&C / D&E The following instructions have been prepared to help you care for yourself upon your return home.   Personal hygiene:  Use sanitary pads for vaginal drainage, not tampons.  Shower the day after your procedure.  NO tub baths, pools or Jacuzzis for 2-3 weeks.  Wipe front to back after using the bathroom.  Activity and limitations:  Do NOT drive or operate any equipment for 24 hours. The effects of anesthesia are still present and drowsiness may result.  Do NOT rest in bed all day.  Walking is encouraged.  Walk up and down stairs slowly.  You may resume your normal activity in one to two days or as indicated by your physician.  Sexual activity: NO intercourse for at least 2 weeks after the procedure, or as indicated by your physician.  Diet: Eat a light meal as desired this evening. You may resume your usual diet tomorrow.  Return to work: You may resume your work activities in one to two days or as indicated by your doctor.  What to expect after your surgery: Expect to have vaginal bleeding/discharge for 2-3 days and spotting for up to 10 days. It is not unusual to have soreness for up to 1-2 weeks. You may have a slight burning sensation when you urinate for the first day. Mild cramps may continue for a couple of days. You may have a regular period in 2-6 weeks.  Call your doctor for any of the following:  Excessive vaginal bleeding, saturating and changing one pad every hour.  Inability to urinate 6 hours after discharge from hospital.  Pain not relieved by pain medication.  Fever of 100.4 F or greater.  Unusual vaginal discharge or odor.   Call for an appointment:    Patients signature: ______________________  Nurses signature ________________________  Support person's signature_______________________   Dilation and Curettage or Vacuum Curettage, Care After This sheet gives you information about how to care for yourself  after your procedure. Your health care provider may also give you more specific instructions. If you have problems or questions, contact your health care provider. What can I expect after the procedure? After your procedure, it is common to have:  Mild pain or cramping.  Some vaginal bleeding or spotting. These may last for up to 2 weeks after your procedure. Follow these instructions at home: Activity    Do not drive or use heavy machinery while taking prescription pain medicine.  Avoid driving for the first 24 hours after your procedure.  Take frequent, short walks, followed by rest periods, throughout the day. Ask your health care provider what activities are safe for you. After 1-2 days, you may be able to return to your normal activities.  Do not lift anything heavier than 10 lb (4.5 kg) until your health care provider approves.  For at least 2 weeks, or as long as told by your health care provider, do not:  [place].  Use tampons.  Have sexual intercourse. General instructions    Take over-the-counter and prescription medicines only as told by your health care provider. This is especially important if you take blood thinning medicine.  Do not take baths, swim, or use a hot tub until your health care provider approves. Take showers instead of baths.  Wear compression stockings as told by your health care provider. These stockings help to prevent blood clots and reduce swelling in your legs.  It is your responsibility to get the results of your procedure. Ask your  health care provider, or the department performing the procedure, when your results will be ready.  Keep all follow-up visits as told by your health care provider. This is important. Contact a health care provider if:  You have severe cramps that get worse or that do not get better with medicine.  You have severe abdominal pain.  You cannot drink fluids without vomiting.  You develop pain in a different area  of your pelvis.  You have bad-smelling vaginal discharge.  You have a rash. Get help right away if:  You have vaginal bleeding that soaks more than one sanitary pad in 1 hour, for 2 hours in a row.  You pass large blood clots from your vagina.  You have a fever that is above 100.10F (38.0C).  Your abdomen feels very tender or hard.  You have chest pain.  You have shortness of breath.  You cough up blood.  You feel dizzy or light-headed.  You faint.  You have pain in your neck or shoulder area. This information is not intended to replace advice given to you by your health care provider. Make sure you discuss any questions you have with your health care provider. Document Released: 02/24/2000 Document Revised: 10/26/2015 Document Reviewed: 09/29/2015 Elsevier Interactive Patient Education  2017 [employer].

## 2016-08-07 NOTE — Anesthesia Procedure Notes (Signed)
Procedure Name: LMA Insertion Date/Time: 08/07/2016 12:57 PM Performed by: Tobin Chad Pre-anesthesia Checklist: Patient identified, Emergency Drugs available, Suction available and Patient being monitored Patient Re-evaluated:Patient Re-evaluated prior to inductionOxygen Delivery Method: Simple face mask Preoxygenation: Pre-oxygenation with 100% oxygen Intubation Type: IV induction Ventilation: Mask ventilation without difficulty LMA: LMA inserted LMA Size: 4.0 Grade View: Grade II Number of attempts: 1 Placement Confirmation: positive ETCO2 and breath sounds checked- equal and bilateral Dental Injury: Teeth and Oropharynx as per pre-operative assessment
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
## 2016-08-08 ENCOUNTER — Encounter (HOSPITAL_COMMUNITY): Payer: Self-pay | Admitting: Obstetrics & Gynecology

## 2016-08-16 ENCOUNTER — Other Ambulatory Visit: Payer: Self-pay | Admitting: Family Medicine

## 2016-08-30 ENCOUNTER — Ambulatory Visit: Payer: Self-pay | Admitting: Family Medicine

## 2016-09-03 ENCOUNTER — Encounter: Payer: Self-pay | Admitting: Family Medicine

## 2016-09-03 ENCOUNTER — Ambulatory Visit (INDEPENDENT_AMBULATORY_CARE_PROVIDER_SITE_OTHER): Payer: Managed Care, Other (non HMO) | Admitting: Family Medicine

## 2016-09-03 VITALS — BP 122/85 | HR 82 | Ht 66.0 in | Wt 208.0 lb

## 2016-09-03 DIAGNOSIS — E038 Other specified hypothyroidism: Secondary | ICD-10-CM

## 2016-09-03 DIAGNOSIS — R7301 Impaired fasting glucose: Secondary | ICD-10-CM | POA: Diagnosis not present

## 2016-09-03 DIAGNOSIS — I10 Essential (primary) hypertension: Secondary | ICD-10-CM

## 2016-09-03 LAB — LIPID PANEL W/REFLEX DIRECT LDL
CHOLESTEROL: 172 mg/dL (ref <200)
HDL: 48 mg/dL — ABNORMAL LOW (ref >50)
LDL-Cholesterol: 106 mg/dL — ABNORMAL HIGH
NON-HDL CHOLESTEROL (CALC): 124 mg/dL (ref <130)
TRIGLYCERIDES: 87 mg/dL (ref <150)
Total CHOL/HDL Ratio: 3.6 Ratio (ref <5.0)

## 2016-09-03 LAB — COMPLETE METABOLIC PANEL WITH GFR
ALBUMIN: 3.8 g/dL (ref 3.6–5.1)
ALK PHOS: 57 U/L (ref 33–130)
ALT: 16 U/L (ref 6–29)
AST: 16 U/L (ref 10–35)
BILIRUBIN TOTAL: 0.3 mg/dL (ref 0.2–1.2)
BUN: 14 mg/dL (ref 7–25)
CALCIUM: 9.2 mg/dL (ref 8.6–10.4)
CO2: 26 mmol/L (ref 20–31)
CREATININE: 0.93 mg/dL (ref 0.50–1.05)
Chloride: 104 mmol/L (ref 98–110)
GFR, Est African American: 81 mL/min (ref >=60)
GFR, Est Non African American: 70 mL/min (ref >=60)
GLUCOSE: 99 mg/dL (ref 65–99)
Potassium: 4.2 mmol/L (ref 3.5–5.3)
SODIUM: 139 mmol/L (ref 135–146)
TOTAL PROTEIN: 6.4 g/dL (ref 6.1–8.1)

## 2016-09-03 LAB — POCT GLYCOSYLATED HEMOGLOBIN (HGB A1C): Hemoglobin A1C: 5.7

## 2016-09-03 LAB — TSH: TSH: 2.22 mIU/L

## 2016-09-03 NOTE — Progress Notes (Signed)
Subjective:    CC: HTN, IFG  HPI: Hypertension- Pt denies chest pain, SOB, dizziness, or heart palpitations.  Taking meds as directed w/o problems.  Denies medication side effects.    Impaired fasting glucose-no increased thirst or urination. No symptoms consistent with hypoglycemia.She admits she hasn't been doing her best with her diet. She's been getting into a few more sweets than usual.   Hypothyroidism-doing well on current regimen. Taking medication consistently. Currently on Armour Thyroid. No recent changes in skin, hair or weight.  Past medical history, Surgical history, Family history not pertinant except as noted below, Social history, Allergies, and medications have been entered into the medical record, reviewed, and corrections made.   Review of Systems: No fevers, chills, night sweats, weight loss, chest pain, or shortness of breath.   Objective:    General: Well Developed, well nourished, and in no acute distress.  Neuro: Alert and oriented x3, extra-ocular muscles intact, sensation grossly intact.  HEENT: Normocephalic, atraumatic  Skin: Warm and dry, no rashes. Cardiac: Regular rate and rhythm, no murmurs rubs or gallops, no lower extremity edema.  Respiratory: Clear to auscultation bilaterally. Not using accessory muscles, speaking in full sentences.   Impression and Recommendations:    HTN - Well controlled. Continue current regimen. Follow up in  6 months.    IFG - Well controlled. Hemoccult A1c of 5.7 today. Continue current regimen. Continue to encourage her to work on diet and regular exercise.  Hypothyroidism-recheck TSH. She feels like things are well controlled. Continue current regimen for now.  Discussed need for colon cancer screening. She declines colonoscopy but she is open to: Guarded as an option. Form completed and we will hold the sheet until she is able to check with her insurance on coverage.

## 2016-09-13 ENCOUNTER — Ambulatory Visit: Payer: Managed Care, Other (non HMO) | Admitting: Obstetrics & Gynecology

## 2016-09-13 ENCOUNTER — Encounter: Payer: Self-pay | Admitting: Obstetrics & Gynecology

## 2016-09-13 VITALS — BP 145/91 | HR 82 | Ht 67.0 in | Wt 205.0 lb

## 2016-09-13 DIAGNOSIS — Z9889 Other specified postprocedural states: Secondary | ICD-10-CM

## 2016-09-13 NOTE — Progress Notes (Signed)
   Subjective:    Patient ID: Kayla Taylor, female    DOB: 03-07-1961, 56 y.o.   MRN: [id number]  HPI 56 yo MW G0 here for postop visit after a d&c, ablation. She has not had any bleeding since. She reports normal bowel and bladder function.   Review of Systems     Objective:   Physical Exam WNWHWFNAD Breathing, conversing, and ambulating normally Abd- benign Cervix- normal       Assessment & Plan:  Post op- doing well RTC 1 year/prn sooner

## 2016-09-17 ENCOUNTER — Other Ambulatory Visit: Payer: Self-pay | Admitting: Family Medicine

## 2016-09-27 ENCOUNTER — Other Ambulatory Visit: Payer: Self-pay | Admitting: Family Medicine

## 2016-09-28 ENCOUNTER — Encounter: Payer: Self-pay | Admitting: Family Medicine

## 2016-09-28 ENCOUNTER — Ambulatory Visit (INDEPENDENT_AMBULATORY_CARE_PROVIDER_SITE_OTHER): Payer: Managed Care, Other (non HMO) | Admitting: Family Medicine

## 2016-09-28 ENCOUNTER — Ambulatory Visit (INDEPENDENT_AMBULATORY_CARE_PROVIDER_SITE_OTHER): Payer: Managed Care, Other (non HMO)

## 2016-09-28 DIAGNOSIS — M25562 Pain in left knee: Secondary | ICD-10-CM | POA: Diagnosis not present

## 2016-09-28 DIAGNOSIS — M1712 Unilateral primary osteoarthritis, left knee: Secondary | ICD-10-CM

## 2016-09-28 MED ORDER — DICLOFENAC SODIUM 1 % TD GEL: 4.0000 g | Freq: Four times a day (QID) | TRANSDERMAL | 11 refills | Status: DC

## 2016-09-28 NOTE — Patient Instructions (Signed)
Thank you for coming in today. Apply voltaren gel to the knee 4x daily.  Over the counter aspercream is also pretty good.  If not better in 2-3 weeks let me know and we will do an injection.   I do recommend some quad strength exercises.   Do 30 reps of straight leg raises 2x daily.   Consider ice therapy. Apply ice or frozen peas to the inside of the knee for 20 mins 2-3x daily.     Meniscus Tear A meniscus tear is a knee injury in which a piece of the meniscus is torn. The meniscus is a thick, rubbery, wedge-shaped cartilage in the knee. Two menisci are located in each knee. They sit between the upper bone (femur) and lower bone (tibia) that make up the knee joint. Each meniscus acts as a shock absorber for the knee. A torn meniscus is one of the most common types of knee injuries. This injury can range from mild to severe. Surgery may be needed for a severe tear. What are the causes? This injury may be caused by any squatting, twisting, or pivoting movement. Sports-related injuries are the most common cause. These often occur from:  Running and stopping suddenly.  Changing direction.  Being tackled or knocked off your feet.  As people get older, their meniscus gets thinner and weaker. In these people, tears can happen more easily, such as from climbing stairs. What increases the risk? This injury is more likely to happen to:  People who play contact sports.  Males.  People who are 54?54 years of age.  What are the signs or symptoms? Symptoms of this injury include:  Knee pain, especially at the side of the knee joint. You may feel pain when the injury occurs, or you may only hear a pop and feel pain later.  A feeling that your knee is clicking, catching, locking, or giving way.  Not being able to fully bend or extend your knee.  Bruising or swelling in your knee.  How is this diagnosed? This injury may be diagnosed based on your symptoms and a physical exam. The  physical exam may include:  Moving your knee in different ways.  Feeling for tenderness.  Listening for a clicking sound.  Checking if your knee locks or catches.  You may also have tests, such as:  X-rays.  MRI.  A procedure to look inside your knee with a narrow surgical telescope (arthroscopy).  You may be referred to a knee specialist (orthopedic surgeon). How is this treated? Treatment for this injury depends on the severity of the tear. Treatment for a mild tear may include:  Rest.  Medicine to reduce pain and swelling. This is usually a nonsteroidal anti-inflammatory drug (NSAID).  A knee brace or an elastic sleeve or wrap.  Using crutches or a walker to keep weight off your knee and to help you walk.  Exercises to strengthen your knee (physical therapy).  You may need surgery if you have a severe tear or if other treatments are not working. Follow these instructions at home: Managing pain and swelling  Take over-the-counter and prescription medicines only as told by your health care provider.  If directed, apply ice to the injured area: ? Put ice in a plastic bag. ? Place a towel between your skin and the bag. ? Leave the ice on for 20 minutes, 2-3 times per day.  Raise (elevate) the injured area above the level of your heart while you are sitting or  lying down. Activity  Do not use the injured limb to support your body weight until your health care provider says that you can. Use crutches or a walker as told by your health care provider.  Return to your normal activities as told by your health care provider. Ask your health care provider what activities are safe for you.  Perform range-of-motion exercises only as told by your health care provider.  Begin doing exercises to strengthen your knee and leg muscles only as told by your health care provider. After you recover, your health care provider may recommend these exercises to help prevent another  injury. General instructions  Use a knee brace or elastic wrap as told by your health care provider.  Keep all follow-up visits as told by your health care provider. This is important. Contact a health care provider if:  You have a fever.  Your knee becomes red, tender, or swollen.  Your pain medicine is not helping.  Your symptoms get worse or do not improve after 2 weeks of home care. This information is not intended to replace advice given to you by your health care provider. Make sure you discuss any questions you have with your health care provider. Document Released: 05/19/2002 Document Revised: 08/04/2015 Document Reviewed: 06/21/2014 Elsevier Interactive Patient Education  [employer].
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
## 2016-10-03 ENCOUNTER — Encounter: Payer: Self-pay | Admitting: Family Medicine

## 2016-10-08 ENCOUNTER — Ambulatory Visit: Payer: Self-pay | Admitting: Podiatry

## 2016-10-22 ENCOUNTER — Encounter: Payer: Self-pay | Admitting: Family Medicine

## 2016-10-23 ENCOUNTER — Ambulatory Visit (INDEPENDENT_AMBULATORY_CARE_PROVIDER_SITE_OTHER): Payer: Managed Care, Other (non HMO) | Admitting: Family Medicine

## 2016-10-23 ENCOUNTER — Encounter: Payer: Self-pay | Admitting: Family Medicine

## 2016-10-23 VITALS — BP 140/78

## 2016-10-23 VITALS — BP 151/83 | HR 83 | Wt 207.0 lb

## 2016-10-23 DIAGNOSIS — R55 Syncope and collapse: Secondary | ICD-10-CM | POA: Diagnosis not present

## 2016-10-23 DIAGNOSIS — M25562 Pain in left knee: Secondary | ICD-10-CM
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
## 2016-10-23 NOTE — Addendum Note (Signed)
Addended by: Beatrice Lecher D on: 10/23/2016 06:21 PM   Modules accepted: Orders

## 2016-10-23 NOTE — Patient Instructions (Signed)
Thank you for coming in today. Attend PT Recheck with me in 6 weeks or sooner if not better.  Do exercise bike.  Also consider water aerobics.   Continue the gel.

## 2016-10-23 NOTE — Patient Instructions (Signed)
Please call again if you have a syncopal episode or a near syncopal episode.
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
## 2016-10-24 MED ORDER — LOSARTAN POTASSIUM 25 MG PO TABS: 25.0000 mg | ORAL_TABLET | Freq: Every day | ORAL | 1 refills | Status: DC

## 2016-10-24 NOTE — Addendum Note (Signed)
Addended by: Huel Cote on: 10/24/2016 09:10 AM   Modules accepted: Orders

## 2016-10-24 NOTE — Addendum Note (Signed)
Addended by: Beatrice Lecher D on: 10/24/2016 08:14 AM   Modules accepted: Orders

## 2016-10-26 LAB — FERRITIN: Ferritin: 36 ng/mL (ref 10–232)

## 2016-10-26 LAB — BASIC METABOLIC PANEL WITH GFR
BUN: 10 mg/dL (ref 7–25)
CALCIUM: 9 mg/dL (ref 8.6–10.4)
CHLORIDE: 106 mmol/L (ref 98–110)
CO2: 22 mmol/L (ref 20–32)
Creat: 0.84 mg/dL (ref 0.50–1.05)
GFR, Est Non African American: 79 mL/min (ref >=60)
GLUCOSE: 99 mg/dL (ref 65–99)
Potassium: 4.1 mmol/L (ref 3.5–5.3)
Sodium: 139 mmol/L (ref 135–146)

## 2016-10-31 ENCOUNTER — Encounter: Payer: Self-pay | Admitting: Physical Therapy

## 2016-10-31 ENCOUNTER — Ambulatory Visit (INDEPENDENT_AMBULATORY_CARE_PROVIDER_SITE_OTHER): Payer: Managed Care, Other (non HMO) | Admitting: Physical Therapy

## 2016-10-31 DIAGNOSIS — M6281 Muscle weakness (generalized): Secondary | ICD-10-CM | POA: Diagnosis not present

## 2016-10-31 DIAGNOSIS — R29898 Other symptoms and signs involving the musculoskeletal system: Secondary | ICD-10-CM | POA: Diagnosis not present

## 2016-10-31 DIAGNOSIS — M25562 Pain in left knee: Secondary | ICD-10-CM | POA: Diagnosis not present

## 2016-10-31 NOTE — Patient Instructions (Addendum)
Balance: Three-Way Leg Swing    Stand on left foot, hands on hips. Reach other foot forward __1__ times, sideways __1__ times, back _1___ times. Hold each position __1__ seconds. Relax. Repeat __10__ times per set. Do __3__ sets per session. Do __1__ sessions per day.  Straight Leg Raise: With External Leg Rotation    Lie on back with right leg straight, opposite leg bent. Rotate straight leg out and lift __10-14__ inches. Repeat _10___ times per set. Do _3___ sets per session. Do _1___ sessions per day.  Quads / HF, Prone    Lie face down, knees together. Grasp one ankle with same-side hand. Use towel if needed to reach. Gently pull foot toward buttock. Hold _45__ seconds. Repeat _1__ times per session. Do __1_ sessions per day. Repeat on the other leg.
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
## 2016-11-05 ENCOUNTER — Ambulatory Visit (INDEPENDENT_AMBULATORY_CARE_PROVIDER_SITE_OTHER): Payer: Managed Care, Other (non HMO) | Admitting: Physical Therapy

## 2016-11-05 ENCOUNTER — Encounter: Payer: Self-pay | Admitting: Physical Therapy

## 2016-11-05 DIAGNOSIS — M6281 Muscle weakness (generalized): Secondary | ICD-10-CM | POA: Diagnosis not present

## 2016-11-05 DIAGNOSIS — M25562 Pain in left knee: Secondary | ICD-10-CM | POA: Diagnosis not present

## 2016-11-05 DIAGNOSIS — R29898 Other symptoms and signs involving the musculoskeletal system: Secondary | ICD-10-CM | POA: Diagnosis not present
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
## 2016-11-05 NOTE — Patient Instructions (Addendum)
Side Kick    Lie on side, back straight along edge of mat, legs 30 in front of torso. Lift top leg to hip height, foot flexed. Exhale, kicking forward twice. Inhale, kicking twice backward with pointed foot. Keep leg hip height, torso still. Repeat __10-20__ times. Repeat on other side. Do _3-4___ sessions per day.  Side Leg Circle    Lie on side, back straight along edge of mat, legs 30 in front of torso. Lift top leg to hip height. Rotate in small circle, _10___ times in each direction. Repeat _10-20___ times. Repeat on other side. Do _3-4___ sessions per week.   Over / Back: "Hot Potato"    Lie on side, back straight along edge of mat, legs 30 in front of torso. Touch toes of top foot in front of bottom leg twice, then quickly lift and touch twice in back. Repeat __10-20__ times. Repeat on other side. Do __3-4__ sessions per week.   Piriformis Stretch, Supine    Lie supine, legs bent, feet flat. Raise one bent leg and, grasping ankle with both hands, pull leg toward opposite shoulder. Hold _10-20__ seconds. Until the hip loosens up.  Repeat _1__ times per session. Do _after the above exercise.

## 2016-11-08 ENCOUNTER — Ambulatory Visit (INDEPENDENT_AMBULATORY_CARE_PROVIDER_SITE_OTHER): Payer: Managed Care, Other (non HMO) | Admitting: Physical Therapy

## 2016-11-08 DIAGNOSIS — M25562 Pain in left knee: Secondary | ICD-10-CM

## 2016-11-08 DIAGNOSIS — M6281 Muscle weakness (generalized): Secondary | ICD-10-CM

## 2016-11-08 DIAGNOSIS — R29898 Other symptoms and signs involving the musculoskeletal system: Secondary | ICD-10-CM
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
## 2016-11-08 NOTE — Patient Instructions (Signed)
Piriformis Stretch, Sitting PNF    Sit, one ankle on opposite knee, same-side hand on crossed knee. Push down on knee, keeping spine straight. Lean torso forward until tension is felt in hamstrings and gluteals of crossed-leg side.  Repeat _2__ times per session. Do _1-2__ sessions per day.  Wall Squat    Feet shoulder width apart, _?___ inches in front of wall, lean against wall. Heels on floor, knees parallel, bend hips and knees to almost 45-90. Hold __3-5__ seconds. Lower slowly, explode on return.  Can do with ball against back.  Repeat _10___ times. Do __2__ sessions per session  .  Copyright  VHI. All rights reserved.

## 2016-11-14 ENCOUNTER — Encounter: Payer: Managed Care, Other (non HMO) | Admitting: Physical Therapy

## 2016-11-16 ENCOUNTER — Other Ambulatory Visit: Payer: Self-pay | Admitting: Family Medicine

## 2016-11-16 ENCOUNTER — Ambulatory Visit (INDEPENDENT_AMBULATORY_CARE_PROVIDER_SITE_OTHER): Payer: Managed Care, Other (non HMO) | Admitting: Physical Therapy

## 2016-11-16 DIAGNOSIS — M6281 Muscle weakness (generalized): Secondary | ICD-10-CM

## 2016-11-16 DIAGNOSIS — M25562 Pain in left knee: Secondary | ICD-10-CM | POA: Diagnosis not present

## 2016-11-16 DIAGNOSIS — R29898 Other symptoms and signs involving the musculoskeletal system: Secondary | ICD-10-CM
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
## 2016-11-16 NOTE — Patient Instructions (Signed)
Abduction: Clam (Eccentric) - Side-Lying    Lie on side with knees bent. Engage core muscles. Lift top knee, keeping feet together. Keep trunk steady. Slowly lower for 3-5 seconds. __10_ reps per set, _2__ sets per day, _3-4__ days per week. Add ___ lbs when you achieve ___ repetitions.

## 2016-11-22 ENCOUNTER — Ambulatory Visit (INDEPENDENT_AMBULATORY_CARE_PROVIDER_SITE_OTHER): Payer: Managed Care, Other (non HMO) | Admitting: Physical Therapy

## 2016-11-22 DIAGNOSIS — M25562 Pain in left knee: Secondary | ICD-10-CM

## 2016-11-22 DIAGNOSIS — M6281 Muscle weakness (generalized): Secondary | ICD-10-CM | POA: Diagnosis not present

## 2016-11-22 DIAGNOSIS — R29898 Other symptoms and signs involving the musculoskeletal system: Secondary | ICD-10-CM | POA: Diagnosis not present
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
## 2016-11-26 ENCOUNTER — Other Ambulatory Visit: Payer: Self-pay | Admitting: Family Medicine

## 2016-11-27 ENCOUNTER — Encounter: Payer: Self-pay | Admitting: Rehabilitative and Restorative Service Providers"

## 2016-11-27 ENCOUNTER — Ambulatory Visit (INDEPENDENT_AMBULATORY_CARE_PROVIDER_SITE_OTHER): Payer: Managed Care, Other (non HMO) | Admitting: Rehabilitative and Restorative Service Providers"

## 2016-11-27 DIAGNOSIS — R29898 Other symptoms and signs involving the musculoskeletal system: Secondary | ICD-10-CM

## 2016-11-27 DIAGNOSIS — M6281 Muscle weakness (generalized): Secondary | ICD-10-CM

## 2016-11-27 DIAGNOSIS — M25562 Pain in left knee: Secondary | ICD-10-CM
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
## 2016-11-27 NOTE — Patient Instructions (Addendum)
Balance: Unilateral    Attempt to balance on left leg, eyes open. Hold _20-30___ seconds. Repeat __3-5__ times per set. Do _2___ sessions per day. Perform exercise with eyes closed.   Strengthening: Hip Abduction - Resisted    With tubing around right leg, other side toward anchor, extend leg out from side. Repeat __10__ times per set. Do __2-3__ sets per session. Do __1-2__ sessions per day.    Strengthening: Hip Extension - Resisted    With tubing around right ankle, face anchor and pull leg straight back. Repeat __10__ times per set. Do __2-3__ sets per session. Do _1-2___ sessions per day.    Strengthening: Hip Abduction (Side-Lying)    Tighten muscles on side of left thigh with leg slightly behind body, keep core tight, then lift leg __8-10__ inches from surface, keeping knee locked.  Repeat _10___ times per set. Do ____ sets per session. Do ____ sessions per day.

## 2016-12-04 ENCOUNTER — Ambulatory Visit (INDEPENDENT_AMBULATORY_CARE_PROVIDER_SITE_OTHER): Payer: Managed Care, Other (non HMO) | Admitting: Family Medicine

## 2016-12-04 VITALS — BP 147/71 | HR 66 | Wt 213.0 lb

## 2016-12-04 DIAGNOSIS — M1712 Unilateral primary osteoarthritis, left knee: Secondary | ICD-10-CM | POA: Diagnosis not present

## 2016-12-04 DIAGNOSIS — M25562 Pain in left knee: Secondary | ICD-10-CM | POA: Diagnosis not present

## 2016-12-04 NOTE — Patient Instructions (Signed)
Thank you for coming in today. Continue home exercise plan.  Recheck with me as needed.  Let me know if you would like to restart PT in the future.
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
## 2017-02-25 ENCOUNTER — Ambulatory Visit: Payer: Self-pay | Admitting: Family Medicine

## 2017-03-01 ENCOUNTER — Ambulatory Visit: Payer: Self-pay | Admitting: Family Medicine

## 2017-03-19 ENCOUNTER — Ambulatory Visit (INDEPENDENT_AMBULATORY_CARE_PROVIDER_SITE_OTHER): Payer: Managed Care, Other (non HMO) | Admitting: Family Medicine

## 2017-03-19 ENCOUNTER — Encounter: Payer: Self-pay | Admitting: Family Medicine

## 2017-03-19 VITALS — BP 138/82 | HR 75 | Ht 67.0 in | Wt 212.0 lb

## 2017-03-19 DIAGNOSIS — Z6833 Body mass index (BMI) 33.0-33.9, adult: Secondary | ICD-10-CM | POA: Diagnosis not present

## 2017-03-19 DIAGNOSIS — R7301 Impaired fasting glucose: Secondary | ICD-10-CM

## 2017-03-19 DIAGNOSIS — Z1231 Encounter for screening mammogram for malignant neoplasm of breast: Secondary | ICD-10-CM | POA: Diagnosis not present

## 2017-03-19 DIAGNOSIS — I10 Essential (primary) hypertension: Secondary | ICD-10-CM

## 2017-03-19 DIAGNOSIS — E038 Other specified hypothyroidism: Secondary | ICD-10-CM | POA: Diagnosis not present

## 2017-03-19 LAB — POCT GLYCOSYLATED HEMOGLOBIN (HGB A1C): HEMOGLOBIN A1C: 5.8

## 2017-03-19 NOTE — Progress Notes (Signed)
Subjective:    CC: BP, glucose  HPI: Hypertension- Pt denies chest pain, SOB, dizziness, or heart palpitations.  Taking meds as directed w/o problems.  Denies medication side effects.    Impaired fasting glucose-no increased thirst or urination. No symptoms consistent with hypoglycemia.  Weight gain-she has gained about 5 pounds since last August.  She has not been exercising regularly.  She says her boyfriend often asked her to go walking with him but usually it is dark outside and so she declines to go.  Past medical history, Surgical history, Family history not pertinant except as noted below, Social history, Allergies, and medications have been entered into the medical record, reviewed, and corrections made.   Review of Systems: No fevers, chills, night sweats, weight loss, chest pain, or shortness of breath.   Objective:    General: Well Developed, well nourished, and in no acute distress.  Neuro: Alert and oriented x3, extra-ocular muscles intact, sensation grossly intact.  HEENT: Normocephalic, atraumatic  Skin: Warm and dry, no rashes. Cardiac: Regular rate and rhythm, no murmurs rubs or gallops, no lower extremity edema.  Respiratory: Clear to auscultation bilaterally. Not using accessory muscles, speaking in full sentences.   Impression and Recommendations:    HTN - Well controlled. Continue current regimen. Follow up in  94months.   IFG - Stable. F/U in 6 months.  Continue to work on healthy diet and regular exercise.  Discussed colon ca screening.  Will check with her insurance to see if they will cover Cologuard.  Hypothyroid-just wants to check TSH once a year.  So we will repeat in June.  Abnormal weight gain/BMI 33-just encouraged her to get more active may be start walking with her boyfriend since the days or can start getting longer again encouraged her to really work on healthy choices and portion control and really try to get her weight back under control.

## 2017-04-04 ENCOUNTER — Encounter: Payer: Self-pay | Admitting: Family Medicine

## 2017-04-09 ENCOUNTER — Other Ambulatory Visit: Payer: Self-pay | Admitting: *Deleted

## 2017-04-09 MED ORDER — LOSARTAN POTASSIUM 25 MG PO TABS: 25.0000 mg | ORAL_TABLET | Freq: Every day | ORAL | 1 refills | Status: DC

## 2017-04-09 MED ORDER — ESTRADIOL 2 MG PO TABS: 2.0000 mg | ORAL_TABLET | Freq: Every day | ORAL | 3 refills | Status: DC

## 2017-06-12 ENCOUNTER — Telehealth: Payer: Self-pay | Admitting: Family Medicine

## 2017-06-12 ENCOUNTER — Ambulatory Visit: Payer: Managed Care, Other (non HMO) | Admitting: Family Medicine

## 2017-06-12 ENCOUNTER — Other Ambulatory Visit: Payer: Self-pay | Admitting: *Deleted

## 2017-06-12 ENCOUNTER — Encounter: Payer: Self-pay | Admitting: Family Medicine

## 2017-06-12 VITALS — BP 134/75 | HR 97 | Ht 67.0 in | Wt 206.0 lb

## 2017-06-12 DIAGNOSIS — Z1231 Encounter for screening mammogram for malignant neoplasm of breast: Secondary | ICD-10-CM | POA: Diagnosis not present

## 2017-06-12 DIAGNOSIS — E038 Other specified hypothyroidism: Secondary | ICD-10-CM | POA: Diagnosis not present

## 2017-06-12 DIAGNOSIS — Z79890 Hormone replacement therapy (postmenopausal): Secondary | ICD-10-CM | POA: Diagnosis not present

## 2017-06-12 NOTE — Telephone Encounter (Signed)
Please call [facility] and see if they will send [place] a refill request for her testosterone cream.  I will be taking over this prescription for her.  I do not think she is due quite yet but we can go ahead and approve it and they can put it on hold.

## 2017-06-12 NOTE — Telephone Encounter (Signed)
Please review this with the paper request from pt's pharmacy.Kayla Taylor, [place]

## 2017-06-12 NOTE — Telephone Encounter (Signed)
[facility], they will send over a request.

## 2017-06-12 NOTE — Progress Notes (Signed)
Subjective:    CC: HRT   HPI:  HRT -Kayla Taylor was going to [facility] and they have been prescribing her testosterone topical cream in addition to her progesterone capsules that she takes at bedtime for sleep.  I have actually been prescribing her estradiol tabs.  She is currently taking 2 mg by mouth daily.  She wanted to know if I would be willing to take over her hormone replacement therapy.  They were wanting her to do pellets but it was in a cost over $300 and just does not want to do that.  Does normally get her mammograms but she is overdue.  She did not get her mammogram last year in 2018.  She is currently on testosterone 2% cream a quarter of a gram daily.  She does one click behind alternating knees.  She gets this from [facility].  And she is currently on micronized progesterone capsules 100 mg, 2 capsules at bedtime for sleep and gets this through her mail order.  He did bring a copy of her blood work and will get that entered into the system.  Female hormones and testosterone overall looks good.  She also was able to check with her insurance and they will cover Cologuard so she would like to get that ordered today for colon cancer screening.  She is also hypothyroid and takes nature thyroid.  Past medical history, Surgical history, Family history not pertinant except as noted below, Social history, Allergies, and medications have been entered into the medical record, reviewed, and corrections made.   Review of Systems: No fevers, chills, night sweats, weight loss, chest pain, or shortness of breath.   Objective:    General: Well Developed, well nourished, and in no acute distress.  Neuro: Alert and oriented x3, extra-ocular muscles intact, sensation grossly intact.  HEENT: Normocephalic, atraumatic  Skin: Warm and dry, no rashes. Cardiac: Regular rate and rhythm, no murmurs rubs or gallops, no lower extremity edema.  Respiratory: Clear to auscultation  bilaterally. Not using accessory muscles, speaking in full sentences.   Impression and Recommendations:    Hypothyroid -continue current regimen.  Due to recheck in June   HRT -I will take over her to test his testosterone cream and progesterone capsules in addition to continuing to prescribe her estradiol tabs.  I did remind her about the increased risks for breast cancer and cardiac disease.  Reminded her that with hormone therapy she has to get her mammogram done yearly.  Went ahead and placed a new order today so that she could go and get that done as soon as possible.  I will call the pharmacy to get her testosterone cream refilled and will just await her next refill for her progesterone capsules that she does not need those yet and she typically gets a 90-day supply through her mail order.  Colon cancer screening-form completed for Cologuard today will fax.  Mammogram ordered.

## 2017-06-13 MED ORDER — NP THYROID 90 MG PO TABS
90.0000 mg | ORAL_TABLET | Freq: Every day | ORAL | 1 refills | Status: DC
Start: 2017-06-13 — End: 2017-10-21

## 2017-06-21 ENCOUNTER — Ambulatory Visit: Payer: Self-pay | Admitting: Family Medicine

## 2017-07-24 ENCOUNTER — Encounter: Payer: Self-pay | Admitting: Family Medicine

## 2017-08-30 ENCOUNTER — Telehealth: Payer: Self-pay | Admitting: Family Medicine

## 2017-08-30 DIAGNOSIS — R928 Other abnormal and inconclusive findings on diagnostic imaging of breast: Secondary | ICD-10-CM

## 2017-08-30 DIAGNOSIS — Z79890 Hormone replacement therapy (postmenopausal): Secondary | ICD-10-CM

## 2017-08-30 NOTE — Telephone Encounter (Signed)
-----   Message from Antonietta Barcelona sent at 06/12/2017  8:57 AM EDT ----- Regarding: Diagnostic Mamm and [place]  Good Morning. This pt never returned for her F/U diagnostic and [place] of findings in LEFT breast. Pt must do this before she can become a screening mammo again. Pt requested to go to [facility] for Diagnostic due to closeness. Pt states she will return to [facility] for normal screening if cleared next year.   Thank you,  Junie Panning

## 2017-08-30 NOTE — Telephone Encounter (Signed)
Please call patient and see if she was able to get her imaging done at [facility].  If we need to send over orders then okay to do so.

## 2017-09-02 NOTE — Telephone Encounter (Signed)
Attempted to contact Pt, no answer and no VM. Do not see imaging in care everywhere. Will place order and fax to [facility].

## 2017-09-14 ENCOUNTER — Other Ambulatory Visit: Payer: Self-pay | Admitting: Family Medicine

## 2017-09-16 ENCOUNTER — Ambulatory Visit: Payer: Managed Care, Other (non HMO) | Admitting: Family Medicine

## 2017-09-19 ENCOUNTER — Ambulatory Visit (INDEPENDENT_AMBULATORY_CARE_PROVIDER_SITE_OTHER): Payer: Managed Care, Other (non HMO) | Admitting: Obstetrics & Gynecology

## 2017-09-19 ENCOUNTER — Encounter: Payer: Self-pay | Admitting: Obstetrics & Gynecology

## 2017-09-19 DIAGNOSIS — Z1151 Encounter for screening for human papillomavirus (HPV): Secondary | ICD-10-CM

## 2017-09-19 DIAGNOSIS — Z01419 Encounter for gynecological examination (general) (routine) without abnormal findings: Secondary | ICD-10-CM | POA: Diagnosis not present

## 2017-09-19 DIAGNOSIS — Z124 Encounter for screening for malignant neoplasm of cervix: Secondary | ICD-10-CM

## 2017-09-19 NOTE — Progress Notes (Signed)
Last pap 07/20/15- normal Last mammogram- 08/31/15- Pt will schedule one soon

## 2017-09-19 NOTE — Progress Notes (Signed)
Subjective:    Kayla Taylor is a 57 y.o. widowed P0  female who presents for an annual exam. The patient has no complaints today. The patient is not currently sexually active. GYN screening history: last pap: was normal. The patient wears seatbelts: yes. The patient participates in regular exercise: yes. Has the patient ever been transfused or tattooed?: yes. The patient reports that there is not domestic violence in her life.   Menstrual History: OB History    Gravida  3   Para      Term      Preterm      AB  3   Living        SAB      TAB      Ectopic      Multiple      Live Births              Menarche age: 30 Patient's last menstrual period was 04/29/2016.    The following portions of the patient's history were reviewed and updated as appropriate: allergies, current medications, past family history, past medical history, past social history, past surgical history and problem list.  Review of Systems Pertinent items are noted in HPI.   FH- + breast cancer in maternal GM, no gyn/colon cancer Plans to do Cologard in the near future No more bleeding since ablation 5/18 Works at a [employer], clinical, pharmacy tech for 29 years   Objective:    LMP 04/29/2016   General Appearance:    Alert, cooperative, no distress, appears stated age  Head:    Normocephalic, without obvious abnormality, atraumatic  Eyes:    PERRL, conjunctiva/corneas clear, EOM's intact, fundi    benign, both eyes  Ears:    Normal TM's and external ear canals, both ears  Nose:   Nares normal, septum midline, mucosa normal, no drainage    or sinus tenderness  Throat:   Lips, mucosa, and tongue normal; teeth and gums normal  Neck:   Supple, symmetrical, trachea midline, no adenopathy;    thyroid:  no enlargement/tenderness/nodules; no carotid   bruit or JVD  Back:     Symmetric, no curvature, ROM normal, no CVA tenderness  Lungs:     Clear to auscultation bilaterally, respirations unlabored   Chest Wall:    No tenderness or deformity   Heart:    Regular rate and rhythm, S1 and S2 normal, no murmur, rub   or gallop  Breast Exam:    No tenderness, masses, or nipple abnormality  Abdomen:     Soft, non-tender, bowel sounds active all four quadrants,    no masses, no organomegaly  Genitalia:    Normal female without lesion, discharge or tenderness     Extremities:   Extremities normal, atraumatic, no cyanosis or edema  Pulses:   2+ and symmetric all extremities  Skin:   Skin color, texture, turgor normal, no rashes or lesions  Lymph nodes:   Cervical, supraclavicular, and axillary nodes normal  Neurologic:   CNII-XII intact, normal strength, sensation and reflexes    throughout  .    Assessment:    Healthy female exam.    Plan:     Thin prep Pap smear. with cotesting Healthy lifestyle choices mammogram

## 2017-09-23 LAB — CYTOLOGY - PAP
Diagnosis: NEGATIVE
HPV (WINDOPATH): NOT DETECTED

## 2017-09-25 ENCOUNTER — Telehealth: Payer: Self-pay

## 2017-09-25 ENCOUNTER — Telehealth: Payer: Self-pay | Admitting: *Deleted

## 2017-09-25 NOTE — Telephone Encounter (Signed)
Pt's pap was normal but showed organisms consistent with yeast infection. I was calling pt to see if she was symptomatic. Pt did not answer. I left a message for her to return call to the office.

## 2017-09-25 NOTE — Telephone Encounter (Signed)
I called pt earlier to let her know her pap was normal but showed possible yeast infection. PT returned my call stating she did not have any symptoms of yeast infection so I told her we would would not treat it at this time.

## 2017-09-25 NOTE — Telephone Encounter (Signed)
lvm informing pt that the cologaurd lab sent a fax stating that she has not sent her colon screening test in. Advised that she complete this and send this in.Kayla Taylor, [place]

## 2017-10-16 ENCOUNTER — Encounter: Payer: Self-pay | Admitting: Family Medicine

## 2017-10-21 ENCOUNTER — Ambulatory Visit: Payer: Managed Care, Other (non HMO) | Admitting: Family Medicine

## 2017-10-21 ENCOUNTER — Encounter: Payer: Self-pay | Admitting: Family Medicine

## 2017-10-21 VITALS — BP 117/80 | HR 71 | Ht 67.0 in | Wt 201.0 lb

## 2017-10-21 DIAGNOSIS — R7301 Impaired fasting glucose: Secondary | ICD-10-CM | POA: Diagnosis not present

## 2017-10-21 DIAGNOSIS — I10 Essential (primary) hypertension: Secondary | ICD-10-CM

## 2017-10-21 DIAGNOSIS — E038 Other specified hypothyroidism: Secondary | ICD-10-CM

## 2017-10-21 DIAGNOSIS — R5383 Other fatigue: Secondary | ICD-10-CM

## 2017-10-21 DIAGNOSIS — K5900 Constipation, unspecified: Secondary | ICD-10-CM

## 2017-10-21 LAB — POCT GLYCOSYLATED HEMOGLOBIN (HGB A1C): Hemoglobin A1C: 5.4 % (ref 4.0–5.6)

## 2017-10-21 LAB — COLOGUARD

## 2017-10-21 MED ORDER — NP THYROID 90 MG PO TABS: 90.0000 mg | ORAL_TABLET | Freq: Every day | ORAL | 1 refills | Status: DC

## 2017-10-21 MED ORDER — PROGESTERONE MICRONIZED 200 MG PO CAPS: 200.0000 mg | ORAL_CAPSULE | Freq: Every day | ORAL | 1 refills | Status: DC

## 2017-10-21 NOTE — Progress Notes (Signed)
Subjective:    CC: HTN, IFG  HPI:  Hypertension- Pt denies chest pain, SOB, dizziness, or heart palpitations.  Taking meds as directed w/o problems.  Denies medication side effects.    Impaired fasting glucose-no increased thirst or urination. No symptoms consistent with hypoglycemia.  Hypothyroidism - Taking medication regularly in the AM away from food and vitamins, etc. unfortunately she has been having some symptoms.  For the last 2 months she is been battling with constipation which she has not had problems with before.  She is started taking Metamucil as well as as needed MiraLAX.  She is noticed that she is had more generalized hair loss on her scalp with a little bit more in the front compared to diffusely.  She is also been expensing some voice hoarseness.  She also reports energy levels are low.  She said when she gets home from work she just does not feel like doing anything she is feels like she does not have the energy.  She is also noticed at work that she is feeling a little bit more brain fog.   Past medical history, Surgical history, Family history not pertinant except as noted below, Social history, Allergies, and medications have been entered into the medical record, reviewed, and corrections made.   Review of Systems: No fevers, chills, night sweats, weight loss, chest pain, or shortness of breath.   Objective:    General: Well Developed, well nourished, and in no acute distress.  Neuro: Alert and oriented x3, extra-ocular muscles intact, sensation grossly intact.  HEENT: Normocephalic, atraumatic, no thyromegaly Skin: Warm and dry, no rashes. Cardiac: Regular rate and rhythm, no murmurs rubs or gallops, no lower extremity edema.  Respiratory: Clear to auscultation bilaterally. Not using accessory muscles, speaking in full sentences.   Impression and Recommendations:    HTN  - Due for labs.  Well controlled. Continue current regimen. Follow up in  23months.   IFG  -hemoglobin A1c looks fantastic and is actually back into the normal range at 5.4 today.  Great job keep up the great work.  Follow-up in 6 months.  Hypothyroid - due to recheck labs and send refills.    Constipation-she is on the right regimen by using her Metamucil and as needed MiraLAX.  Low Energy-might be related to her poor quality of sleep recently.  Can usually fall asleep but just having difficulty staying asleep.  She did double her melatonin and that has helped some.  She has scheduled her mammogram.  She just completed her Cologuard this morning and will be dropping it off.

## 2017-10-22 ENCOUNTER — Encounter: Payer: Self-pay | Admitting: Family Medicine

## 2017-10-22 ENCOUNTER — Other Ambulatory Visit: Payer: Self-pay | Admitting: Family Medicine

## 2017-10-22 DIAGNOSIS — E038 Other specified hypothyroidism: Secondary | ICD-10-CM

## 2017-10-22 LAB — COMPLETE METABOLIC PANEL WITH GFR
AG Ratio: 1.5 (calc) (ref 1.0–2.5)
ALT: 16 U/L (ref 6–29)
AST: 18 U/L (ref 10–35)
Albumin: 4.1 g/dL (ref 3.6–5.1)
Alkaline phosphatase (APISO): 63 U/L (ref 33–130)
BUN: 15 mg/dL (ref 7–25)
CALCIUM: 8.9 mg/dL (ref 8.6–10.4)
CO2: 27 mmol/L (ref 20–32)
CREATININE: 0.8 mg/dL (ref 0.50–1.05)
Chloride: 106 mmol/L (ref 98–110)
GFR, EST NON AFRICAN AMERICAN: 83 mL/min/{1.73_m2} (ref >=60)
GFR, Est African American: 96 mL/min/{1.73_m2} (ref >=60)
GLUCOSE: 105 mg/dL — AB (ref 65–99)
Globulin: 2.7 g/dL (calc) (ref 1.9–3.7)
Potassium: 4.2 mmol/L (ref 3.5–5.3)
SODIUM: 140 mmol/L (ref 135–146)
Total Bilirubin: 0.4 mg/dL (ref 0.2–1.2)
Total Protein: 6.8 g/dL (ref 6.1–8.1)

## 2017-10-22 LAB — THYROID PEROXIDASE ANTIBODY

## 2017-10-22 LAB — CBC
HCT: 38.3 % (ref 35.0–45.0)
Hemoglobin: 13.1 g/dL (ref 11.7–15.5)
MCH: 31.1 pg (ref 27.0–33.0)
MCHC: 34.2 g/dL (ref 32.0–36.0)
MCV: 91 fL (ref 80.0–100.0)
MPV: 9.9 fL (ref 7.5–12.5)
PLATELETS: 247 10*3/uL (ref 140–400)
RBC: 4.21 10*6/uL (ref 3.80–5.10)
RDW: 12.6 % (ref 11.0–15.0)
WBC: 5.4 10*3/uL (ref 3.8–10.8)

## 2017-10-22 LAB — LIPID PANEL
CHOL/HDL RATIO: 2.8 (calc) (ref <5.0)
CHOLESTEROL: 170 mg/dL (ref <200)
HDL: 60 mg/dL (ref >50)
LDL Cholesterol (Calc): 89 mg/dL (calc)
NON-HDL CHOLESTEROL (CALC): 110 mg/dL (ref <130)
TRIGLYCERIDES: 110 mg/dL (ref <150)

## 2017-10-22 LAB — T4, FREE: FREE T4: 0.7 ng/dL — AB (ref 0.8–1.8)

## 2017-10-22 LAB — T3, FREE: T3, Free: 2.5 pg/mL (ref 2.3–4.2)

## 2017-10-22 LAB — TSH: TSH: 2.34 m[IU]/L

## 2017-10-22 MED ORDER — THYROID 30 MG PO TABS: 90.0000 mg | ORAL_TABLET | Freq: Every day | ORAL | 1 refills | Status: DC

## 2017-10-22 NOTE — Progress Notes (Signed)
90 no longer available. Changed to 3 of the 30

## 2017-10-25 MED ORDER — LEVOTHYROXINE SODIUM 150 MCG PO TABS: 150.0000 ug | ORAL_TABLET | Freq: Every day | ORAL | 1 refills | Status: DC

## 2017-10-25 MED ORDER — LEVOTHYROXINE SODIUM 150 MCG PO TABS: 150.0000 ug | ORAL_TABLET | Freq: Every day | ORAL | 0 refills | Status: DC

## 2017-10-25 NOTE — Addendum Note (Signed)
Addended by: Beatrice Lecher D on: 10/25/2017 01:01 PM   Modules accepted: Orders

## 2017-10-28 ENCOUNTER — Other Ambulatory Visit: Payer: Self-pay | Admitting: *Deleted

## 2017-10-28 DIAGNOSIS — E038 Other specified hypothyroidism: Secondary | ICD-10-CM

## 2017-10-30 ENCOUNTER — Telehealth: Payer: Self-pay | Admitting: Family Medicine

## 2017-10-30 NOTE — Telephone Encounter (Signed)
Call pt: Cologuard is negative. Repeat colon cancer screening in 3 years.  

## 2017-10-31 NOTE — Telephone Encounter (Signed)
Called patient and LM on VM of normal results and follow up in 3 years. Call back number provided if any questions. KG LPN

## 2017-11-07 LAB — HM MAMMOGRAPHY

## 2017-12-18 ENCOUNTER — Encounter: Payer: Self-pay | Admitting: Family Medicine

## 2017-12-20 ENCOUNTER — Encounter: Payer: Self-pay | Admitting: Family Medicine

## 2017-12-20 ENCOUNTER — Other Ambulatory Visit: Payer: Self-pay | Admitting: *Deleted

## 2017-12-20 DIAGNOSIS — E038 Other specified hypothyroidism: Secondary | ICD-10-CM

## 2017-12-20 LAB — TSH+FREE T4: TSH W/REFLEX TO FT4: 0.05 m[IU]/L — ABNORMAL LOW

## 2017-12-20 LAB — T4, FREE: Free T4: 1.7 ng/dL (ref 0.8–1.8)

## 2018-01-21 ENCOUNTER — Ambulatory Visit: Payer: Managed Care, Other (non HMO) | Admitting: Family Medicine

## 2018-01-21 ENCOUNTER — Ambulatory Visit (INDEPENDENT_AMBULATORY_CARE_PROVIDER_SITE_OTHER): Payer: Managed Care, Other (non HMO)

## 2018-01-21 ENCOUNTER — Encounter: Payer: Self-pay | Admitting: Family Medicine

## 2018-01-21 VITALS — BP 135/89 | HR 69 | Wt 200.0 lb

## 2018-01-21 DIAGNOSIS — M25512 Pain in left shoulder: Secondary | ICD-10-CM

## 2018-01-21 MED ORDER — DICLOFENAC SODIUM 1 % TD GEL: 2.0000 g | Freq: Four times a day (QID) | TRANSDERMAL | 11 refills | Status: DC
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
## 2018-01-21 NOTE — Patient Instructions (Signed)
Thank you for coming in today.  Get xray on the way out.   Attend PT.   Do the home exercises.  Up the front Up to the side Internal  External rotation   30 reps 2-3x daily.   Recheck in 6 weeks.  Return sooner if needed.   If all better ok to cancel follow up visit.     SLAP Lesions Superior labrum anterior posterior (SLAP) lesions are injuries to part of the connective tissue (cartilage) of the shoulder joint. The top of the upper arm bone (humerus) fits into a socket in the shoulder blade to form the shoulder joint. There is a firm rim of cartilage (labrum) around the edge of the socket. The labrum helps to deepen the socket and hold the humerus in place. If a certain part of the labrum becomes frayed or torn, it is called a SLAP lesion. A SLAP lesion can cause shoulder pain, instability, and weakness. SLAP lesions are common among athletes who play sports that involve repeated overhead movements. SLAP lesions may include a tear in the cord of tissue that attaches the muscle in the front of the upper arm to the shoulder blade (proximal biceps tendon). What are the causes? This condition may be caused by:  A sudden (acute) injury, which can result from: ? Falling on an outstretched arm. ? Movement of the shoulder joint out of its normal place (dislocation). ? A direct hit to the shoulder.  Wear and tear over time, which can result from doing activities or sports that involve overhead arm movements.  What increases the risk? The following factors may make you more likely to develop a SLAP lesion:  Having had a dislocated shoulder in the past.  Being age 46 or older.  Playing certain sports, such as: ? Sports that involve repeated overhead movements, such as baseball or volleyball. ? Sports that put backward pressure on the arms when the arms are overhead, such as gymnastics or basketball. ? Contact sports.  Lifting weights.  What are the signs or symptoms? The main  symptom of this condition is shoulder pain that gets worse when lifting a heavy object or raising the arm overhead. Other signs and symptoms may include:  Feeling like your shoulder is locking, catching, grinding, or popping.  Loss of strength.  Stiffness and limited range of motion.  Loss of throwing power ("dead arm").  How is this diagnosed? This condition may be diagnosed based on:  Your symptoms.  Your medical history.  A physical exam.  Imaging tests, such as MRIs.  How is this treated? Treatment for this condition may include:  Resting your shoulder by avoiding activities that cause shoulder pain.  NSAIDs to help reduce pain and swelling.  Physical therapy to improve strength and range of motion.  Surgery. This may be done if other treatment methods do not help. Surgery may involve: ? Removing frayed pieces of the labrum. ? Repairing tears. ? Reattaching the labrum. ? Repairing the biceps tendon.  Follow these instructions at home: Managing pain, stiffness, and swelling  If directed, put ice on the injured area. ? Put ice in a plastic bag. ? Place a towel between your skin and the bag. ? Leave the ice on for 20 minutes, 2-3 times a day. Driving  Do not drive or operate heavy machinery while taking prescription pain medicine.  Ask your health care provider when it is safe for you to drive. Activity  Return to your normal activities as told  by your health care provider. Ask your health care provider what activities are safe for you.  Do exercises as told by your health care provider. General instructions   Do not use any tobacco products, such as cigarettes, chewing tobacco, or e-cigarettes. Tobacco can delay bone healing. If you need help quitting, ask your health care provider.  Take over-the-counter and prescription medicines only as told by your health care provider.  Keep all follow-up visits as told by your health care provider. This is  important. How is this prevented?  Be safe and responsible while being active to avoid falls.  Maintain physical fitness, including strength and flexibility. Contact a health care provider if:  Your symptoms have not improved after 6 months of treatment.  Your symptoms get worse instead of getting better. This information is not intended to replace advice given to you by your health care provider. Make sure you discuss any questions you have with your health care provider. Document Released: 02/26/2005 Document Revised: 11/03/2015 Document Reviewed: 01/29/2015 Elsevier Interactive Patient Education  2018 [employer].   SLAP Lesions Rehab Ask your health care provider which exercises are safe for you. Do exercises exactly as told by your health care provider and adjust them as directed. It is normal to feel mild stretching, pulling, tightness, or discomfort as you do these exercises, but you should stop right away if you feel sudden pain or your pain gets worse.Do not begin these exercises until told by your health care provider. Stretching and range of motion exercise This exercise warms up your muscles and joints and improves the movement and flexibility of your shoulder. This exercise also helps to relieve pain and stiffness. Exercise A: Passive shoulder horizontal adduction  1. Sit or stand and pull your left / right elbow across your chest, toward your other shoulder. Stop when you feel a gentle stretch in the back of your shoulder and upper arm. ? Keep your arm at shoulder height. ? Keep your arm as close to your body as you comfortably can. 2. Hold for __________ seconds. 3. Slowly return to the starting position. Repeat __________ times. Complete this exercise __________ times a day. Strengthening exercises These exercises build strength and endurance in your shoulder. Endurance is the ability to use your muscles for a long time, even after they get tired. Exercise B: Scapular  protraction, supine  1. Lie on your back on a firm surface. If directed, hold a __________ weight in your left / right hand. 2. Raise your left / right arm straight into the air so your hand is directly above your shoulder joint. 3. Lift your shoulder off of the floor so you push the weight into the air. Do not move your head, neck, or back. 4. Hold for __________ seconds. 5. Slowly return to the starting position. Let your muscles relax completely before you repeat this exercise. Repeat __________ times. Complete this exercise __________ times a day. Exercise C: Scapular retraction  1. Sit in a stable chair without armrests, or stand. 2. Secure an exercise band to a stable object in front of you so the band is at shoulder height. 3. Hold one end of the exercise band in each hand. 4. Squeeze your shoulder blades together and move your elbows slightly behind you. Do not shrug your shoulders. 5. Hold for __________ seconds. 6. Slowly return to the starting position. Repeat __________ times. Complete this exercise __________ times a day. Exercise D: Shoulder external rotation 1. Lie down on your  left / right side. 2. Bend your left / right elbow to an "L" shape (90 degrees). Place a small pillow or a rolled-up towel under your left / right upper arm. 3. With your elbow bent to 90 degrees, place your left / right hand on your abdomen. 4. Squeeze your shoulder blade back toward your spine. 5. Keeping your upper arm against the pillow or towel, move (pivot) your forearm and hand away from your abdomen and toward the ceiling. Keep your elbow bent to 90 degrees. 6. Hold for __________ seconds. 7. Slowly return to the starting position. Repeat __________ times. Complete this exercise __________ times a day. Exercise E: Shoulder extension, prone  1. Lie on your abdomen on a firm surface so your left / right arm hangs over the edge. 2. Hold a __________ weight in your hand so your palm faces in  toward your body. Your arm should be straight. 3. Squeeze your shoulder blade down toward the middle of your back. 4. Slowly raise your arm behind you, up to the height of the surface that you are lying on. Keep your arm straight. 5. Hold for __________ seconds. 6. Slowly return to the starting position and relax your muscles. Repeat __________ times. Complete this exercise __________ times a day. This information is not intended to replace advice given to you by your health care provider. Make sure you discuss any questions you have with your health care provider. Document Released: 02/26/2005 Document Revised: 11/03/2015 Document Reviewed: 01/29/2015 Elsevier Interactive Patient Education  [employer].

## 2018-01-25 ENCOUNTER — Other Ambulatory Visit: Payer: Self-pay | Admitting: Family Medicine

## 2018-01-30 ENCOUNTER — Ambulatory Visit (INDEPENDENT_AMBULATORY_CARE_PROVIDER_SITE_OTHER): Payer: Managed Care, Other (non HMO) | Admitting: Rehabilitative and Restorative Service Providers"

## 2018-01-30 ENCOUNTER — Encounter: Payer: Self-pay | Admitting: Rehabilitative and Restorative Service Providers"

## 2018-01-30 DIAGNOSIS — R293 Abnormal posture: Secondary | ICD-10-CM | POA: Diagnosis not present

## 2018-01-30 DIAGNOSIS — R29898 Other symptoms and signs involving the musculoskeletal system: Secondary | ICD-10-CM | POA: Diagnosis not present

## 2018-01-30 DIAGNOSIS — M25512 Pain in left shoulder: Secondary | ICD-10-CM | POA: Diagnosis not present
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
## 2018-01-30 NOTE — Patient Instructions (Addendum)
Axial Extension (Chin Tuck)    Pull chin in and lengthen back of neck. Hold __5__ seconds while counting out loud. Repeat __10__ times. Do __several__ sessions per day.  Shoulder Blade Squeeze    Rotate shoulders back, then squeeze shoulder blades down and back. Hold 10 sec Repeat __10__ times. Do ___several_ sessions per day.  Upper Back Strength: Lower Trapezius / Rotator Cuff " L's "     Arms in waitress pose, palms up. Press hands back and slide shoulder blades down. Hold for __5__ seconds. Repeat _10___ times. 1-2 times per day.    Scapular Retraction: Elbow Flexion (Standing)  "W's"     With elbows bent to 90, pinch shoulder blades together and rotate arms out, keeping elbows bent. Repeat __10__ times per set. Do __1-2__ sets per session. Do _several ___ sessions per day.   Flexors Stretch, Standing    Stand about a thigh's length from table. Grip edges of table. Bend knees until stretch is felt under shoulder blades in chest. Hold _10__ seconds. Repeat _3-5__ times per session. Do __2-3_ sessions per day.  Scapula Adduction With Pectoralis Stretch: Low - Standing   Shoulders at 45 hands even with shoulders, keeping weight through legs, shift weight forward until you feel pull or stretch through the front of your chest. Hold _30__ seconds. Do _3__ times, _2-4__ times per day.   Scapula Adduction With Pectoralis Stretch: Mid-Range - Standing   Shoulders at 90 elbows even with shoulders, keeping weight through legs, shift weight forward until you feel pull or strength through the front of your chest. Hold __30_ seconds. Do _3__ times, __2-4_ times per day.   Scapula Adduction With Pectoralis Stretch: High - Standing   Shoulders at 120 hands up high on the doorway, keeping weight on feet, shift weight forward until you feel pull or stretch through the front of your chest. Hold _30__ seconds. Do _3__ times, _2-3__ times per day.   [facility] [place], [place] [address]  [phone] (office) [phone] (fax)    TENS UNIT: This is helpful for muscle pain and spasm.   Search and Purchase a TENS 7000 2nd edition at www.tenspros.com. It should be less than $30.     TENS unit instructions: Do not shower or bathe with the unit on Turn the unit off before removing electrodes or batteries If the electrodes lose stickiness add a drop of water to the electrodes after they are disconnected from the unit and place on plastic sheet. If you continued to have difficulty, call the TENS unit company to purchase more electrodes. Do not apply lotion on the skin area prior to use. Make sure the skin is clean and dry as this will help prolong the life of the electrodes. After use, always check skin for unusual red areas, rash or other skin difficulties. If there are any skin problems, does not apply electrodes to the same area. Never remove the electrodes from the unit by pulling the wires. Do not use the TENS unit or electrodes other than as directed. Do not change electrode placement without consultating your therapist or physician. Keep 2 fingers with between each electrode.

## 2018-02-04 ENCOUNTER — Encounter: Payer: Self-pay | Admitting: Rehabilitative and Restorative Service Providers"

## 2018-02-11 ENCOUNTER — Ambulatory Visit: Payer: Managed Care, Other (non HMO) | Admitting: Physical Therapy

## 2018-02-11 ENCOUNTER — Encounter: Payer: Self-pay | Admitting: Physical Therapy

## 2018-02-11 DIAGNOSIS — M25512 Pain in left shoulder: Secondary | ICD-10-CM

## 2018-02-11 DIAGNOSIS — R293 Abnormal posture: Secondary | ICD-10-CM

## 2018-02-11 DIAGNOSIS — R29898 Other symptoms and signs involving the musculoskeletal system: Secondary | ICD-10-CM

## 2018-02-11 DIAGNOSIS — M6281 Muscle weakness (generalized): Secondary | ICD-10-CM
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
## 2018-02-11 NOTE — Patient Instructions (Signed)
Pectoral Stretch, Standing    Stand in doorframe with palms against frame and arms at 90. Step through doorway allowing arms to straighten. Hold __20_ seconds.  Repeat __2_ times per session. Do ___ sessions per day. ONE ARM ONLY!!  hold door frame with straight elbow and turn body away.  Copyright  VHI. All rights reserved.   Elbow Press    Interlace fingers; bring hands underneath head. Press elbows down. Hold _20__ seconds. Relax arms. Repeat _3__ times.  Chest / Bicep Stretch    Lace fingers behind back and squeeze shoulder blades together. Slowly raise and straighten arms. Hold __20_ seconds. Repeat __2__ times per set. Do _1_ sets per session. Do _1-2_ sessions per day.    3 times per week:  Resisted External Rotation: in Neutral - Bilateral    Like holding a tray in each hand.  Sit or stand, tubing in both hands, elbows at sides, bent to 90, forearms forward. PALMS UP. Pinch shoulder blades together and rotate forearms out. Keep elbows at sides. Repeat __10__ times per set. Do __2__ sets per session. Do _3___ sessions per week.  Low Row: Standing    Face anchor, feet shoulder width apart. Palms up, pull arms back, squeezing shoulder blades together. Repeat 10__ times per set. Do _2 sets per session. Do _3_ sessions per week. Anchor Height: Waist   * arm to the front x 2 sets of 10 *arm out to the side (45 deg angle) x 2 sets of 10

## 2018-02-14 ENCOUNTER — Encounter: Payer: Self-pay | Admitting: Rehabilitative and Restorative Service Providers"

## 2018-02-18 ENCOUNTER — Encounter: Payer: Self-pay | Admitting: Rehabilitative and Restorative Service Providers"

## 2018-02-19 ENCOUNTER — Encounter: Payer: Self-pay | Admitting: Rehabilitative and Restorative Service Providers"

## 2018-02-19 ENCOUNTER — Ambulatory Visit: Payer: Managed Care, Other (non HMO) | Admitting: Rehabilitative and Restorative Service Providers"

## 2018-02-19 DIAGNOSIS — R29898 Other symptoms and signs involving the musculoskeletal system: Secondary | ICD-10-CM

## 2018-02-19 DIAGNOSIS — R293 Abnormal posture: Secondary | ICD-10-CM | POA: Diagnosis not present

## 2018-02-19 DIAGNOSIS — M25512 Pain in left shoulder: Secondary | ICD-10-CM | POA: Diagnosis not present
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
## 2018-02-19 NOTE — Patient Instructions (Addendum)
[facility]  [place] [phone] [email or web address]   Resisted External Rotation: in Neutral - Bilateral bathroom door or fridge door    PALMS UP Sit or stand, tubing in both hands, elbows at sides, bent to 90, forearms forward. Pinch shoulder blades together and rotate forearms out. Keep elbows at sides. Repeat __10__ times per set. Do _2-3___ sets per session. Do _2-3___ sessions per day.   Low Row: Standing   Face anchor, feet shoulder width apart. Palms up, pull arms back, squeezing shoulder blades together. Repeat 10__ times per set. Do 2-3__ sets per session. Do 1x/day   Bow and arrow (hunger games)   Strengthening: Resisted Extension   Hold tubing in right hand, arm forward. Pull arm back, elbow straight. Repeat _10___ times per set. Do 2-3____ sets per session. Do 2-3____ sessions per day.  Strengthening: Resisted External Rotation    Hold tubing in right hand, elbow at side and forearm across body. Rotate forearm out. Repeat __10__ times per set. Do _1-3___ sets per session. Do __1__ sessions per day.    Strengthening: Resisted Internal Rotation    Hold tubing in left hand, elbow at side and forearm out. Rotate forearm in across body. Repeat _10__ times per set. Do __1-3__ sets per session. Do ___1_ sessions per day.    Flexors Stretch, Standing    Stand near wall and slide arm up, with palm facing away from wall, by leaning toward wall. Hold _20-30__ seconds.  Repeat __3_ times per session. Do __1-2_ sessions per day.

## 2018-02-26 ENCOUNTER — Encounter: Payer: Self-pay | Admitting: Rehabilitative and Restorative Service Providers"

## 2018-02-27 ENCOUNTER — Encounter: Payer: Self-pay | Admitting: Family Medicine

## 2018-02-28 ENCOUNTER — Encounter: Payer: Self-pay | Admitting: Family Medicine

## 2018-02-28 ENCOUNTER — Ambulatory Visit: Payer: Managed Care, Other (non HMO) | Admitting: Family Medicine

## 2018-02-28 VITALS — BP 142/79 | HR 75 | Ht 67.0 in | Wt 210.0 lb

## 2018-02-28 DIAGNOSIS — M25512 Pain in left shoulder: Secondary | ICD-10-CM
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
## 2018-02-28 NOTE — Patient Instructions (Signed)
Thank you for coming in today. Continue home exercises.  Ok to finish up with PT.  Recheck with me as needed.

## 2018-03-11 ENCOUNTER — Encounter

## 2018-04-23 ENCOUNTER — Ambulatory Visit: Payer: Self-pay | Admitting: Family Medicine

## 2018-04-23 ENCOUNTER — Other Ambulatory Visit: Payer: Self-pay | Admitting: Family Medicine

## 2018-04-29 ENCOUNTER — Ambulatory Visit (INDEPENDENT_AMBULATORY_CARE_PROVIDER_SITE_OTHER): Payer: Managed Care, Other (non HMO) | Admitting: Family Medicine

## 2018-04-29 ENCOUNTER — Encounter: Payer: Self-pay | Admitting: Family Medicine

## 2018-04-29 VITALS — BP 138/76 | HR 77 | Ht 67.0 in | Wt 212.0 lb

## 2018-04-29 DIAGNOSIS — I10 Essential (primary) hypertension: Secondary | ICD-10-CM | POA: Diagnosis not present

## 2018-04-29 DIAGNOSIS — R7301 Impaired fasting glucose: Secondary | ICD-10-CM

## 2018-04-29 DIAGNOSIS — E038 Other specified hypothyroidism: Secondary | ICD-10-CM | POA: Diagnosis not present

## 2018-04-29 LAB — POCT GLYCOSYLATED HEMOGLOBIN (HGB A1C): Hemoglobin A1C: 5.6 % (ref 4.0–5.6)

## 2018-04-29 LAB — BASIC METABOLIC PANEL WITH GFR
BUN: 10 mg/dL (ref 7–25)
CO2: 28 mmol/L (ref 20–32)
CREATININE: 0.83 mg/dL (ref 0.50–1.05)
Calcium: 9 mg/dL (ref 8.6–10.4)
Chloride: 103 mmol/L (ref 98–110)
GFR, EST AFRICAN AMERICAN: 92 mL/min/{1.73_m2} (ref >=60)
GFR, Est Non African American: 79 mL/min/{1.73_m2} (ref >=60)
GLUCOSE: 95 mg/dL (ref 65–99)
Potassium: 4.3 mmol/L (ref 3.5–5.3)
SODIUM: 138 mmol/L (ref 135–146)

## 2018-04-29 LAB — TSH: TSH: 0.03 m[IU]/L — AB

## 2018-04-29 NOTE — Progress Notes (Signed)
Subjective:    CC: IFG  HPI:  Impaired fasting glucose-no increased thirst or urination. No symptoms consistent with hypoglycemia.  Hypertension- Pt denies chest pain, SOB, dizziness, or heart palpitations.  Taking meds as directed w/o problems.  Denies medication side effects.    Hypothyroidism - Taking medication regularly in the AM away from food and vitamins, etc. No recent change to skin, hair, or energy levels.   Past medical history, Surgical history, Family history not pertinant except as noted below, Social history, Allergies, and medications have been entered into the medical record, reviewed, and corrections made.   Review of Systems: No fevers, chills, night sweats, weight loss, chest pain, or shortness of breath.   Objective:    General: Well Developed, well nourished, and in no acute distress.  Neuro: Alert and oriented x3, extra-ocular muscles intact, sensation grossly intact.  HEENT: Normocephalic, atraumatic  Skin: Warm and dry, no rashes. Cardiac: Regular rate and rhythm, no murmurs rubs or gallops, no lower extremity edema.  Respiratory: Clear to auscultation bilaterally. Not using accessory muscles, speaking in full sentences.   Impression and Recommendations:    IFG - A1C is ok at 5.6.  F/U in 6 months.    HTN - Well controlled. Continue current regimen. Follow up in  6 months.    Hypothyroidism - due to recheck Thyroid.

## 2018-04-30 ENCOUNTER — Encounter: Payer: Self-pay | Admitting: Family Medicine

## 2018-07-20 ENCOUNTER — Other Ambulatory Visit: Payer: Self-pay | Admitting: Family Medicine

## 2018-08-15 ENCOUNTER — Other Ambulatory Visit: Payer: Self-pay | Admitting: Family Medicine

## 2018-08-15 DIAGNOSIS — E038 Other specified hypothyroidism: Secondary | ICD-10-CM

## 2018-09-22 ENCOUNTER — Encounter: Payer: Self-pay | Admitting: Family Medicine

## 2018-09-22 DIAGNOSIS — E038 Other specified hypothyroidism: Secondary | ICD-10-CM

## 2018-09-22 DIAGNOSIS — R7301 Impaired fasting glucose: Secondary | ICD-10-CM

## 2018-09-22 DIAGNOSIS — I10 Essential (primary) hypertension: Secondary | ICD-10-CM

## 2018-09-22 NOTE — Telephone Encounter (Signed)
Thank you.  Labs signed.  She can go at her convenience she will need to fast.

## 2018-09-22 NOTE — Telephone Encounter (Signed)
Pended an A1C and CMP for pt.. is this OK to order?

## 2018-09-23 ENCOUNTER — Other Ambulatory Visit: Payer: Self-pay | Admitting: Family Medicine

## 2018-09-23 DIAGNOSIS — Z1231 Encounter for screening mammogram for malignant neoplasm of breast: Secondary | ICD-10-CM

## 2018-10-28 ENCOUNTER — Other Ambulatory Visit: Payer: Self-pay

## 2018-10-28 ENCOUNTER — Ambulatory Visit (INDEPENDENT_AMBULATORY_CARE_PROVIDER_SITE_OTHER): Payer: Managed Care, Other (non HMO) | Admitting: Family Medicine

## 2018-10-28 ENCOUNTER — Encounter: Payer: Self-pay | Admitting: Family Medicine

## 2018-10-28 VITALS — BP 134/81 | HR 81 | Ht 67.0 in | Wt 211.0 lb

## 2018-10-28 DIAGNOSIS — R7301 Impaired fasting glucose: Secondary | ICD-10-CM

## 2018-10-28 DIAGNOSIS — I10 Essential (primary) hypertension: Secondary | ICD-10-CM

## 2018-10-28 DIAGNOSIS — E038 Other specified hypothyroidism: Secondary | ICD-10-CM

## 2018-10-28 LAB — POCT GLYCOSYLATED HEMOGLOBIN (HGB A1C): Hemoglobin A1C: 5.8 % — AB (ref 4.0–5.6)

## 2018-10-28 MED ORDER — ESTRADIOL 2 MG PO TABS: 2.0000 mg | ORAL_TABLET | Freq: Every day | ORAL | 1 refills | Status: DC

## 2018-10-28 MED ORDER — LOSARTAN POTASSIUM 50 MG PO TABS: 50.0000 mg | ORAL_TABLET | Freq: Every day | ORAL | 1 refills | Status: DC

## 2018-10-28 MED ORDER — LEVOTHYROXINE SODIUM 150 MCG PO TABS: 150.0000 ug | ORAL_TABLET | Freq: Every day | ORAL | 1 refills | Status: DC

## 2018-10-28 MED ORDER — LOSARTAN POTASSIUM 25 MG PO TABS: 25.0000 mg | ORAL_TABLET | Freq: Every day | ORAL | 3 refills | Status: DC

## 2018-10-28 MED ORDER — PROGESTERONE MICRONIZED 200 MG PO CAPS: 200.0000 mg | ORAL_CAPSULE | Freq: Every day | ORAL | 1 refills | Status: DC

## 2018-10-28 NOTE — Assessment & Plan Note (Signed)
A1C is up a little form previous. Continue to work on diet and exercise. Recheck in in 6 mo.

## 2018-10-28 NOTE — Assessment & Plan Note (Signed)
BP not maximally controlled.  Will increase losartan to 50mg .  F/U in 6 months.
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
## 2018-10-28 NOTE — Assessment & Plan Note (Signed)
Recheck TSH. Adjust as needed. She is doing well but has gained some weight.

## 2018-10-29 ENCOUNTER — Encounter: Payer: Self-pay | Admitting: Family Medicine

## 2018-10-29 DIAGNOSIS — E038 Other specified hypothyroidism: Secondary | ICD-10-CM

## 2018-10-29 LAB — COMPLETE METABOLIC PANEL WITH GFR
AG Ratio: 1.5 (calc) (ref 1.0–2.5)
ALT: 14 U/L (ref 6–29)
AST: 15 U/L (ref 10–35)
Albumin: 4.1 g/dL (ref 3.6–5.1)
Alkaline phosphatase (APISO): 55 U/L (ref 37–153)
BUN: 10 mg/dL (ref 7–25)
CO2: 27 mmol/L (ref 20–32)
Calcium: 9.5 mg/dL (ref 8.6–10.4)
Chloride: 105 mmol/L (ref 98–110)
Creat: 0.83 mg/dL (ref 0.50–1.05)
GFR, Est African American: 91 mL/min/{1.73_m2} (ref >=60)
GFR, Est Non African American: 79 mL/min/{1.73_m2} (ref >=60)
Globulin: 2.7 g/dL (calc) (ref 1.9–3.7)
Glucose, Bld: 108 mg/dL — ABNORMAL HIGH (ref 65–99)
Potassium: 4.7 mmol/L (ref 3.5–5.3)
Sodium: 138 mmol/L (ref 135–146)
Total Bilirubin: 0.4 mg/dL (ref 0.2–1.2)
Total Protein: 6.8 g/dL (ref 6.1–8.1)

## 2018-10-29 LAB — T4, FREE: Free T4: 1.6 ng/dL (ref 0.8–1.8)

## 2018-10-29 LAB — LIPID PANEL
Cholesterol: 191 mg/dL (ref <200)
HDL: 55 mg/dL (ref >=50)
LDL Cholesterol (Calc): 111 mg/dL (calc) — ABNORMAL HIGH
Non-HDL Cholesterol (Calc): 136 mg/dL (calc) — ABNORMAL HIGH (ref <130)
Total CHOL/HDL Ratio: 3.5 (calc) (ref <5.0)
Triglycerides: 135 mg/dL (ref <150)

## 2018-10-29 LAB — TSH+FREE T4: TSH W/REFLEX TO FT4: 0.07 mIU/L — ABNORMAL LOW (ref 0.40–4.50)

## 2018-10-29 MED ORDER — LEVOTHYROXINE SODIUM 150 MCG PO TABS: 150.0000 ug | ORAL_TABLET | Freq: Every day | ORAL | 0 refills | Status: DC

## 2018-11-11 ENCOUNTER — Encounter: Payer: Self-pay | Admitting: Family Medicine

## 2018-11-12 ENCOUNTER — Other Ambulatory Visit: Payer: Self-pay

## 2018-11-12 ENCOUNTER — Ambulatory Visit (INDEPENDENT_AMBULATORY_CARE_PROVIDER_SITE_OTHER): Payer: Managed Care, Other (non HMO)

## 2018-11-12 DIAGNOSIS — Z1231 Encounter for screening mammogram for malignant neoplasm of breast: Secondary | ICD-10-CM

## 2018-12-01 ENCOUNTER — Ambulatory Visit: Payer: Managed Care, Other (non HMO) | Admitting: Obstetrics & Gynecology

## 2018-12-05 ENCOUNTER — Ambulatory Visit: Payer: Managed Care, Other (non HMO) | Admitting: Obstetrics and Gynecology

## 2018-12-05 ENCOUNTER — Encounter: Payer: Self-pay | Admitting: Family Medicine

## 2018-12-05 DIAGNOSIS — E038 Other specified hypothyroidism: Secondary | ICD-10-CM

## 2018-12-10 ENCOUNTER — Encounter: Payer: Self-pay | Admitting: Family Medicine

## 2018-12-10 DIAGNOSIS — E038 Other specified hypothyroidism: Secondary | ICD-10-CM

## 2018-12-10 LAB — TSH+FREE T4: TSH W/REFLEX TO FT4: 0.02 mIU/L — ABNORMAL LOW (ref 0.40–4.50)

## 2018-12-10 LAB — T4, FREE: Free T4: 1.7 ng/dL (ref 0.8–1.8)

## 2018-12-10 MED ORDER — LEVOTHYROXINE SODIUM 112 MCG PO TABS: 112.0000 ug | ORAL_TABLET | Freq: Every day | ORAL | 0 refills | Status: DC

## 2018-12-26 ENCOUNTER — Ambulatory Visit (INDEPENDENT_AMBULATORY_CARE_PROVIDER_SITE_OTHER): Payer: Managed Care, Other (non HMO) | Admitting: Obstetrics and Gynecology

## 2018-12-26 ENCOUNTER — Encounter: Payer: Self-pay | Admitting: Obstetrics and Gynecology

## 2018-12-26 ENCOUNTER — Other Ambulatory Visit: Payer: Self-pay

## 2018-12-26 VITALS — BP 134/89 | HR 75 | Ht 67.0 in | Wt 202.0 lb

## 2018-12-26 DIAGNOSIS — Z01419 Encounter for gynecological examination (general) (routine) without abnormal findings: Secondary | ICD-10-CM

## 2018-12-26 DIAGNOSIS — Z124 Encounter for screening for malignant neoplasm of cervix: Secondary | ICD-10-CM

## 2018-12-26 DIAGNOSIS — Z1151 Encounter for screening for human papillomavirus (HPV): Secondary | ICD-10-CM | POA: Diagnosis not present
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
## 2018-12-26 NOTE — Progress Notes (Signed)
Last pap 09/19/17- negative Mammogram- 11/12/18- normal

## 2018-12-31 LAB — CYTOLOGY - PAP
Comment: NEGATIVE
Diagnosis: NEGATIVE
High risk HPV: NEGATIVE

## 2019-02-10 ENCOUNTER — Other Ambulatory Visit: Payer: Self-pay

## 2019-02-10 ENCOUNTER — Encounter: Payer: Self-pay | Admitting: Family Medicine

## 2019-02-10 ENCOUNTER — Ambulatory Visit (INDEPENDENT_AMBULATORY_CARE_PROVIDER_SITE_OTHER): Payer: Managed Care, Other (non HMO) | Admitting: Family Medicine

## 2019-02-10 VITALS — BP 135/81 | HR 71 | Ht 67.0 in | Wt 195.0 lb

## 2019-02-10 DIAGNOSIS — G250 Essential tremor: Secondary | ICD-10-CM | POA: Diagnosis not present

## 2019-02-10 DIAGNOSIS — E038 Other specified hypothyroidism: Secondary | ICD-10-CM | POA: Diagnosis not present

## 2019-02-10 NOTE — Patient Instructions (Signed)

## 2019-02-10 NOTE — Assessment & Plan Note (Signed)
Due to recheck TSH today and will adjust dose as needed.

## 2019-02-10 NOTE — Assessment & Plan Note (Signed)
Based on her description and history and normal exam today I do think this is a benign head tremor which is a form of essential tremor even though she is not having any symptoms present in her hands currently.  It is interesting that 2 of her sisters have a hand tremor.  It does not sound like dystonia as the movement does not sound like it is a jerking motion.  I do not think this is cerebellar either she is not having any ataxia etc.  I did encourage her to try to get her boyfriend to record the tremor for me as that would be helpful and I will be happy to look at it the next time she is in.  In the meantime we will just continue to monitor and I gave her reassurance.  Now its not bothersome to her.
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
## 2019-02-11 ENCOUNTER — Other Ambulatory Visit: Payer: Self-pay | Admitting: *Deleted

## 2019-02-11 DIAGNOSIS — E038 Other specified hypothyroidism: Secondary | ICD-10-CM

## 2019-02-11 LAB — TSH: TSH: 0.14 mIU/L — ABNORMAL LOW (ref 0.40–4.50)

## 2019-02-25 ENCOUNTER — Other Ambulatory Visit: Payer: Self-pay | Admitting: Family Medicine

## 2019-02-25 DIAGNOSIS — E038 Other specified hypothyroidism: Secondary | ICD-10-CM

## 2019-03-20 ENCOUNTER — Encounter: Payer: Self-pay | Admitting: Family Medicine

## 2019-04-30 ENCOUNTER — Ambulatory Visit: Payer: Managed Care, Other (non HMO) | Admitting: Family Medicine

## 2019-05-07 ENCOUNTER — Ambulatory Visit (INDEPENDENT_AMBULATORY_CARE_PROVIDER_SITE_OTHER): Payer: Managed Care, Other (non HMO) | Admitting: Family Medicine

## 2019-05-07 ENCOUNTER — Encounter: Payer: Self-pay | Admitting: Family Medicine

## 2019-05-07 ENCOUNTER — Other Ambulatory Visit: Payer: Self-pay

## 2019-05-07 VITALS — BP 133/76 | HR 71 | Ht 67.0 in | Wt 205.0 lb

## 2019-05-07 DIAGNOSIS — R7301 Impaired fasting glucose: Secondary | ICD-10-CM | POA: Diagnosis not present

## 2019-05-07 DIAGNOSIS — Z79890 Hormone replacement therapy (postmenopausal): Secondary | ICD-10-CM | POA: Diagnosis not present

## 2019-05-07 DIAGNOSIS — Z23 Encounter for immunization: Secondary | ICD-10-CM

## 2019-05-07 DIAGNOSIS — E038 Other specified hypothyroidism: Secondary | ICD-10-CM

## 2019-05-07 DIAGNOSIS — I10 Essential (primary) hypertension: Secondary | ICD-10-CM

## 2019-05-07 LAB — POCT GLYCOSYLATED HEMOGLOBIN (HGB A1C): Hemoglobin A1C: 5.5 % (ref 4.0–5.6)

## 2019-05-07 LAB — BASIC METABOLIC PANEL WITH GFR
BUN: 11 mg/dL (ref 7–25)
CO2: 27 mmol/L (ref 20–32)
Calcium: 9.5 mg/dL (ref 8.6–10.4)
Chloride: 103 mmol/L (ref 98–110)
Creat: 0.89 mg/dL (ref 0.50–1.05)
GFR, Est African American: 84 mL/min/{1.73_m2} (ref >=60)
GFR, Est Non African American: 72 mL/min/{1.73_m2} (ref >=60)
Glucose, Bld: 101 mg/dL — ABNORMAL HIGH (ref 65–99)
Potassium: 4.3 mmol/L (ref 3.5–5.3)
Sodium: 138 mmol/L (ref 135–146)

## 2019-05-07 MED ORDER — PROGESTERONE MICRONIZED 200 MG PO CAPS: 200.0000 mg | ORAL_CAPSULE | Freq: Every day | ORAL | 1 refills | Status: DC

## 2019-05-07 MED ORDER — LOSARTAN POTASSIUM 50 MG PO TABS: 50.0000 mg | ORAL_TABLET | Freq: Every day | ORAL | 1 refills | Status: DC

## 2019-05-07 MED ORDER — LEVOTHYROXINE SODIUM 112 MCG PO TABS: ORAL_TABLET | ORAL | 3 refills | Status: DC

## 2019-05-07 MED ORDER — ESTRADIOL 2 MG PO TABS: 2.0000 mg | ORAL_TABLET | Freq: Every day | ORAL | 1 refills | Status: DC
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
## 2019-05-07 NOTE — Assessment & Plan Note (Signed)
A1c looks phenomenal today.  Down to 5.5.  She is on an excellent job.  Plan to recheck again in 6 months.

## 2019-05-07 NOTE — Assessment & Plan Note (Signed)
Getting yearly mammography.  Okay to continue estradiol.

## 2019-05-07 NOTE — Assessment & Plan Note (Signed)
Sent TSH in December looked perfect at 0.1.  Continue current regimen and plan to recheck again in 6 months.

## 2019-05-07 NOTE — Assessment & Plan Note (Signed)
Pressure overall is okay.  I still love to see that systolic under [id number].  She is doing great with recent start of exercise and says in the summer she usually loves to walk outdoors.  This encouraged her to continue to work on healthy diet and weight loss.

## 2019-05-08 NOTE — Progress Notes (Signed)
All labs are normal. 

## 2019-10-11 ENCOUNTER — Other Ambulatory Visit: Payer: Self-pay | Admitting: Family Medicine

## 2019-10-30 ENCOUNTER — Other Ambulatory Visit: Payer: Self-pay | Admitting: Family Medicine

## 2019-11-04 ENCOUNTER — Encounter: Payer: Self-pay | Admitting: Family Medicine

## 2019-11-04 ENCOUNTER — Other Ambulatory Visit: Payer: Self-pay

## 2019-11-04 ENCOUNTER — Ambulatory Visit (INDEPENDENT_AMBULATORY_CARE_PROVIDER_SITE_OTHER): Payer: Managed Care, Other (non HMO) | Admitting: Family Medicine

## 2019-11-04 VITALS — BP 136/71 | HR 86 | Ht 67.0 in | Wt 195.0 lb

## 2019-11-04 DIAGNOSIS — R7301 Impaired fasting glucose: Secondary | ICD-10-CM | POA: Diagnosis not present

## 2019-11-04 DIAGNOSIS — E038 Other specified hypothyroidism: Secondary | ICD-10-CM

## 2019-11-04 DIAGNOSIS — Z23 Encounter for immunization: Secondary | ICD-10-CM

## 2019-11-04 DIAGNOSIS — I10 Essential (primary) hypertension: Secondary | ICD-10-CM

## 2019-11-04 DIAGNOSIS — Z1231 Encounter for screening mammogram for malignant neoplasm of breast: Secondary | ICD-10-CM

## 2019-11-04 LAB — LIPID PANEL W/REFLEX DIRECT LDL
Cholesterol: 196 mg/dL (ref <200)
HDL: 59 mg/dL (ref >=50)
LDL Cholesterol (Calc): 116 mg/dL (calc) — ABNORMAL HIGH
Non-HDL Cholesterol (Calc): 137 mg/dL (calc) — ABNORMAL HIGH (ref <130)
Total CHOL/HDL Ratio: 3.3 (calc) (ref <5.0)
Triglycerides: 105 mg/dL (ref <150)

## 2019-11-04 LAB — POCT GLYCOSYLATED HEMOGLOBIN (HGB A1C): Hemoglobin A1C: 5.7 % — AB (ref 4.0–5.6)

## 2019-11-04 LAB — TSH: TSH: 0.31 mIU/L — ABNORMAL LOW (ref 0.40–4.50)

## 2019-11-04 NOTE — Assessment & Plan Note (Signed)
Due to recheck TSH and make any adjustments needed.  Last TSH was a little overly suppressed at 0.1 in December.
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
## 2019-11-04 NOTE — Assessment & Plan Note (Signed)
Blood pressure is borderline today.  Normally it is better than that but we will just continue to keep an eye out all see her back in 6 months.

## 2019-11-04 NOTE — Assessment & Plan Note (Signed)
A1C of 5.7.  Doing well.  Continue current regimen continue to monitor diet and get regular exercise she is actually lost a fair amount of weight since she was last here approximately 10 pounds so congratulated her on that.

## 2019-11-04 NOTE — Progress Notes (Signed)
She would like to discuss COVID vaccine

## 2019-11-06 ENCOUNTER — Encounter: Payer: Self-pay | Admitting: Family Medicine

## 2019-11-25 ENCOUNTER — Ambulatory Visit: Payer: Managed Care, Other (non HMO)

## 2019-11-25 ENCOUNTER — Other Ambulatory Visit: Payer: Self-pay

## 2020-01-07 ENCOUNTER — Other Ambulatory Visit: Payer: Self-pay

## 2020-01-07 ENCOUNTER — Ambulatory Visit (INDEPENDENT_AMBULATORY_CARE_PROVIDER_SITE_OTHER): Payer: Managed Care, Other (non HMO)

## 2020-01-07 DIAGNOSIS — Z1231 Encounter for screening mammogram for malignant neoplasm of breast: Secondary | ICD-10-CM | POA: Diagnosis not present

## 2020-05-05 ENCOUNTER — Ambulatory Visit: Payer: Managed Care, Other (non HMO) | Admitting: Family Medicine

## 2020-05-17 ENCOUNTER — Encounter: Payer: Self-pay | Admitting: Family Medicine

## 2020-05-17 ENCOUNTER — Other Ambulatory Visit: Payer: Self-pay

## 2020-05-17 ENCOUNTER — Ambulatory Visit: Payer: Managed Care, Other (non HMO) | Admitting: Family Medicine

## 2020-05-17 VITALS — BP 130/78 | HR 69 | Ht 67.0 in | Wt 208.0 lb

## 2020-05-17 DIAGNOSIS — R7301 Impaired fasting glucose: Secondary | ICD-10-CM | POA: Diagnosis not present

## 2020-05-17 DIAGNOSIS — T50Z95A Adverse effect of other vaccines and biological substances, initial encounter: Secondary | ICD-10-CM | POA: Diagnosis not present

## 2020-05-17 DIAGNOSIS — E038 Other specified hypothyroidism: Secondary | ICD-10-CM

## 2020-05-17 DIAGNOSIS — I10 Essential (primary) hypertension: Secondary | ICD-10-CM

## 2020-05-17 DIAGNOSIS — Z6832 Body mass index (BMI) 32.0-32.9, adult: Secondary | ICD-10-CM

## 2020-05-17 LAB — POCT GLYCOSYLATED HEMOGLOBIN (HGB A1C): Hemoglobin A1C: 5.1 % (ref 4.0–5.6)

## 2020-05-17 NOTE — Assessment & Plan Note (Signed)
Feels like she is doing really well on her current regimen.  Due to recheck TSH.
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
## 2020-05-17 NOTE — Assessment & Plan Note (Signed)
BP up a little bit today not quite optimal.  We will plan to recheck before she goes today.  We will get up-to-date blood work including a BMP.

## 2020-05-17 NOTE — Assessment & Plan Note (Signed)
Her fingerstick A1c came down to 5.1 which is a significant drop especially considering she had not been eating well and had actually gained weight since I last saw her so there is a little bit of a discrepancies here some to recheck the A1c on her venipuncture labs.

## 2020-05-18 LAB — BASIC METABOLIC PANEL WITH GFR
BUN: 13 mg/dL (ref 7–25)
CO2: 28 mmol/L (ref 20–32)
Calcium: 9.7 mg/dL (ref 8.6–10.4)
Chloride: 105 mmol/L (ref 98–110)
Creat: 0.9 mg/dL (ref 0.50–1.05)
GFR, Est African American: 82 mL/min/{1.73_m2} (ref >=60)
GFR, Est Non African American: 71 mL/min/{1.73_m2} (ref >=60)
Glucose, Bld: 87 mg/dL (ref 65–139)
Potassium: 4.5 mmol/L (ref 3.5–5.3)
Sodium: 140 mmol/L (ref 135–146)

## 2020-05-18 LAB — HEMOGLOBIN A1C
Hgb A1c MFr Bld: 5.5 % of total Hgb (ref <5.7)
Mean Plasma Glucose: 111 mg/dL
eAG (mmol/L): 6.2 mmol/L

## 2020-05-18 LAB — TSH: TSH: 1.27 mIU/L (ref 0.40–4.50)

## 2020-06-05 ENCOUNTER — Encounter: Payer: Self-pay | Admitting: Family Medicine

## 2020-06-06 ENCOUNTER — Other Ambulatory Visit: Payer: Self-pay | Admitting: Family Medicine

## 2020-06-06 DIAGNOSIS — E038 Other specified hypothyroidism: Secondary | ICD-10-CM

## 2020-08-15 ENCOUNTER — Other Ambulatory Visit: Payer: Self-pay

## 2020-08-15 ENCOUNTER — Other Ambulatory Visit (HOSPITAL_COMMUNITY)
Admission: RE | Admit: 2020-08-15 | Discharge: 2020-08-15 | Disposition: A | Discharge status: Home / Self Care | Payer: Managed Care, Other (non HMO) | Source: Ambulatory Visit | Attending: Obstetrics & Gynecology | Admitting: Obstetrics & Gynecology

## 2020-08-15 ENCOUNTER — Encounter: Payer: Self-pay | Admitting: Obstetrics & Gynecology

## 2020-08-15 ENCOUNTER — Ambulatory Visit (INDEPENDENT_AMBULATORY_CARE_PROVIDER_SITE_OTHER): Payer: Managed Care, Other (non HMO) | Admitting: Obstetrics & Gynecology

## 2020-08-15 VITALS — BP 128/80 | HR 83 | Resp 16 | Ht 67.0 in | Wt 201.0 lb

## 2020-08-15 DIAGNOSIS — N898 Other specified noninflammatory disorders of vagina: Secondary | ICD-10-CM | POA: Insufficient documentation

## 2020-08-15 DIAGNOSIS — Z01419 Encounter for gynecological examination (general) (routine) without abnormal findings: Secondary | ICD-10-CM

## 2020-08-15 DIAGNOSIS — Z79890 Hormone replacement therapy (postmenopausal): Secondary | ICD-10-CM | POA: Diagnosis not present

## 2020-08-15 MED ORDER — ESTRADIOL 1 MG PO TABS: ORAL_TABLET | ORAL | 3 refills | Status: DC

## 2020-08-15 NOTE — Progress Notes (Signed)
Subjective:     Kayla Taylor is a 60 y.o. female here for a routine exam.  Current complaints: none.  Takes HRT.  NO bleeding. No hot flashes.  Some urinary incontinence and wears pads     Gynecologic History Patient's last menstrual period was 04/29/2016. Contraception: post menopausal status Last Pap: 2020. Results were: normal Last mammogram: 2021. Results were: normal  Obstetric History OB History  Gravida Para Term Preterm AB Living  3       3    SAB IAB Ectopic Multiple Live Births               # Outcome Date GA Lbr Len/2nd Weight Sex Delivery Anes PTL Lv  3 AB           2 AB           1 AB              The following portions of the patient's history were reviewed and updated as appropriate: allergies, current medications, past family history, past medical history, past social history, past surgical history and problem list.  Review of Systems Pertinent items noted in HPI and remainder of comprehensive ROS otherwise negative.    Objective:      Vitals:   08/15/20 0806  BP: 128/80  Pulse: 83  Resp: 16  Weight: 201 lb (91.2 kg)  Height: 5\' 7"  (1.702 m)   Vitals:  WNL General appearance: alert, cooperative and no distress  HEENT: Normocephalic, without obvious abnormality, atraumatic Eyes: negative Throat: lips, mucosa, and tongue normal; teeth and gums normal  Respiratory: Clear to auscultation bilaterally  CV: Regular rate and rhythm  Breasts:  Normal appearance, no masses or tenderness, no nipple retraction or dimpling  GI: Soft, non-tender; bowel sounds normal; no masses,  no organomegaly  GU: External Genitalia:  Tanner V, no lesion Urethra:  No prolapse   Vagina: Pink, normal rugae, no blood, large amount greenish curd like discharge  Cervix: No CMT, no lesion  Uterus:  Normal size and contour, non tender  Adnexa: Normal, no masses, non tender  Musculoskeletal: No edema, redness or tenderness in the calves or thighs  Skin: No lesions or rash   Lymphatic: Axillary adenopathy: none     Psychiatric: Normal mood and behavior        Assessment:    Healthy female exam.    Plan:    1.  Pap up to date 2.  Vale has been on HRT for 4 to 6 years.  Patient is amendable to weaning.  We will decrease her estradiol to 1.5 mg a day for a month then down to 1 mg for a month and then 0.5 mg for a month.  At that point we will stop all HRT.  Patient will keep [place] updated on how she is feeling. 3.  Patient does Cologuard for colon cancer screening.  Her primary care doctor manages this 4.  Mammogram up-to-date 5.  Aptima sent for discharge.  Most likely yeast.  If positive, will send Diflucan to the [facility] near the [place]. 6.  Pt advised to intake Calcium and Vitamin

## 2020-08-16 LAB — CERVICOVAGINAL ANCILLARY ONLY
Bacterial Vaginitis (gardnerella): NEGATIVE
Candida Glabrata: NEGATIVE
Candida Vaginitis: POSITIVE — AB
Comment: NEGATIVE
Comment: NEGATIVE
Comment: NEGATIVE

## 2020-08-17 ENCOUNTER — Telehealth: Payer: Self-pay | Admitting: *Deleted

## 2020-08-17 MED ORDER — FLUCONAZOLE 150 MG PO TABS: ORAL_TABLET | ORAL | 0 refills | Status: DC

## 2020-08-17 NOTE — Telephone Encounter (Signed)
Pt notified of positive yeast and RX was sent to [facility] in [place] per Dr Gala Romney.

## 2020-08-22 IMAGING — MG MM DIGITAL SCREENING BILAT W/ TOMO W/ CAD
8 series · 8 of 24 positions shown · non-contrast
Comparison: Previous exam(s).

CLINICAL DATA: Screening.

EXAM:
DIGITAL SCREENING BILATERAL MAMMOGRAM WITH TOMO AND CAD

[L MLO synth-2D]
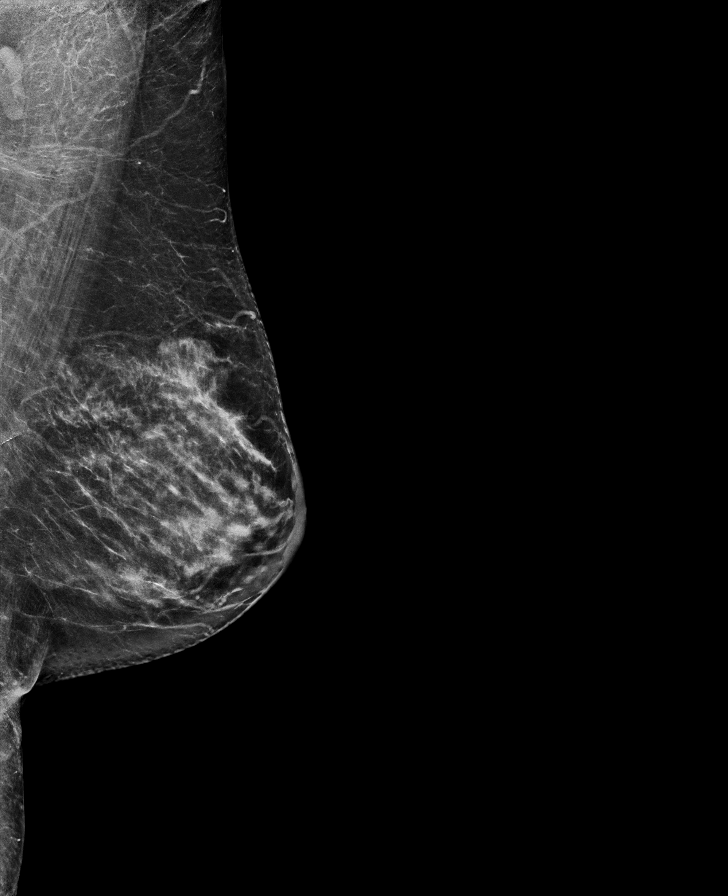

[R MLO synth-2D]
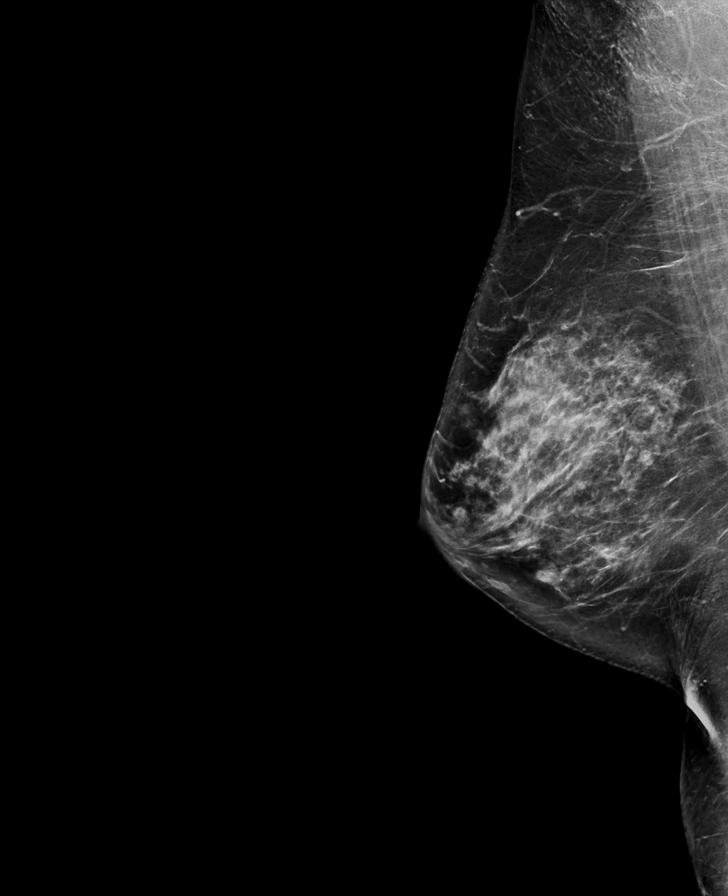

[R CC synth-2D]
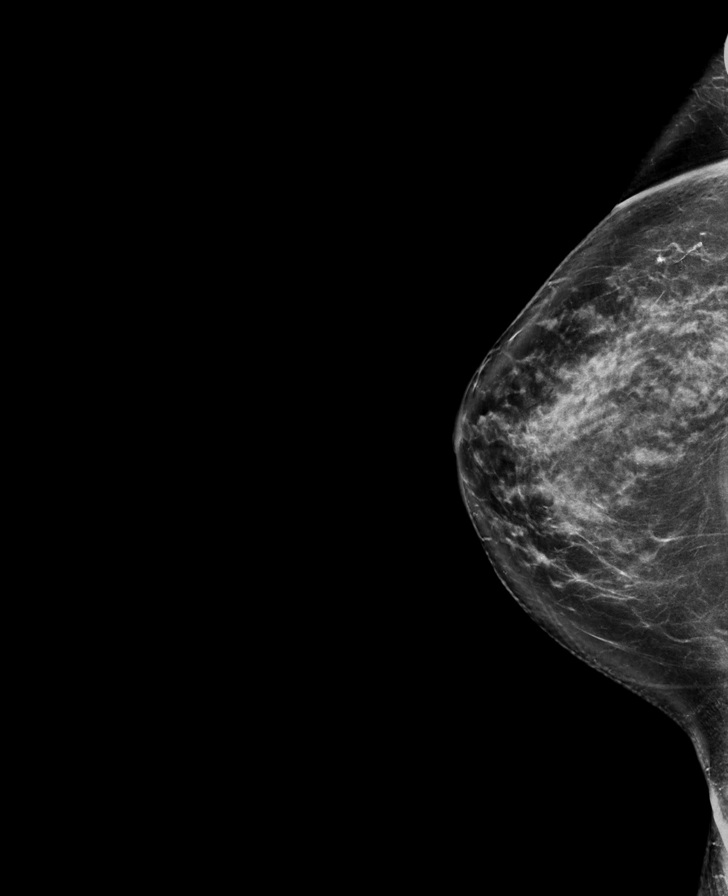

[L CC synth-2D]
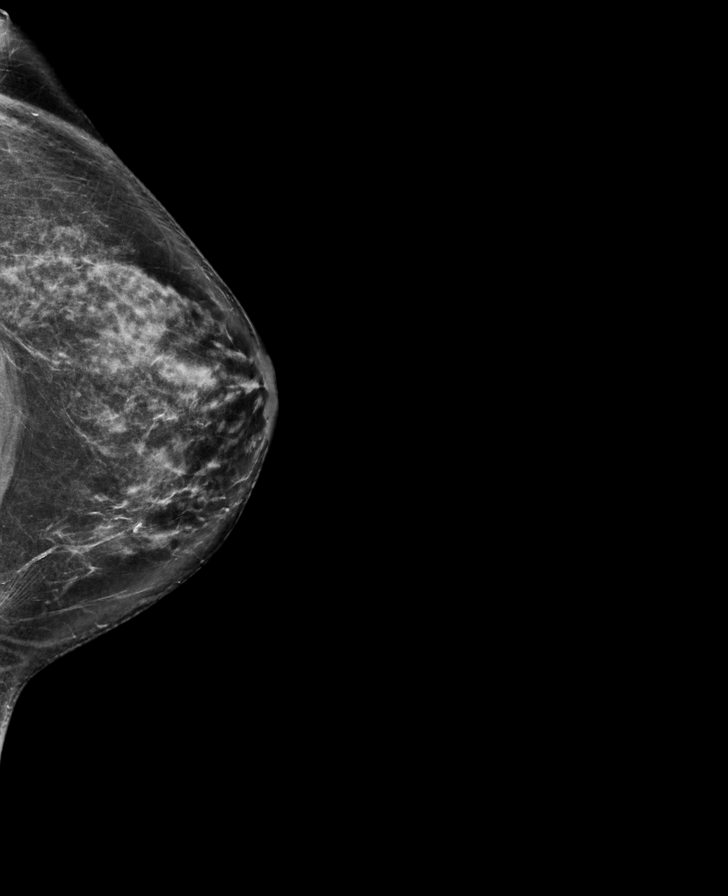

[R CC tomo · tomo slice 39/78.0]
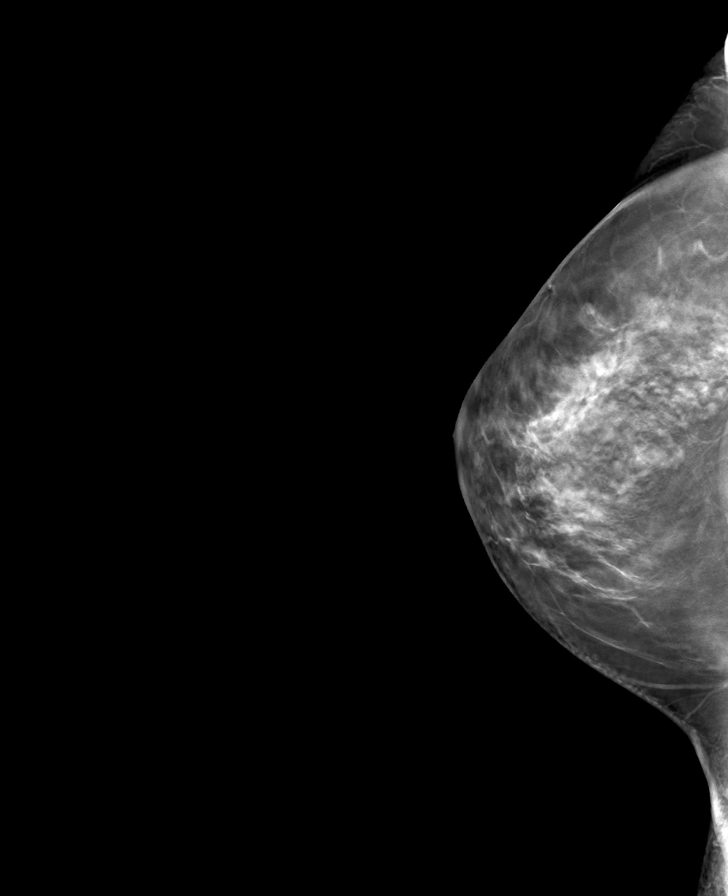

[L CC tomo · tomo slice 38/75.0]
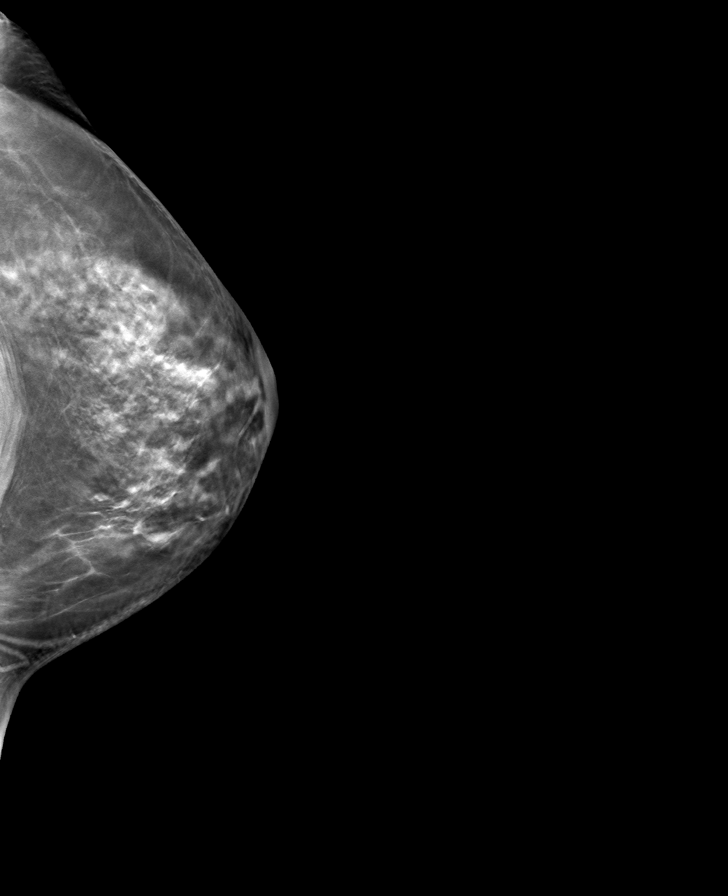

[R MLO tomo · tomo slice 41/80.0]
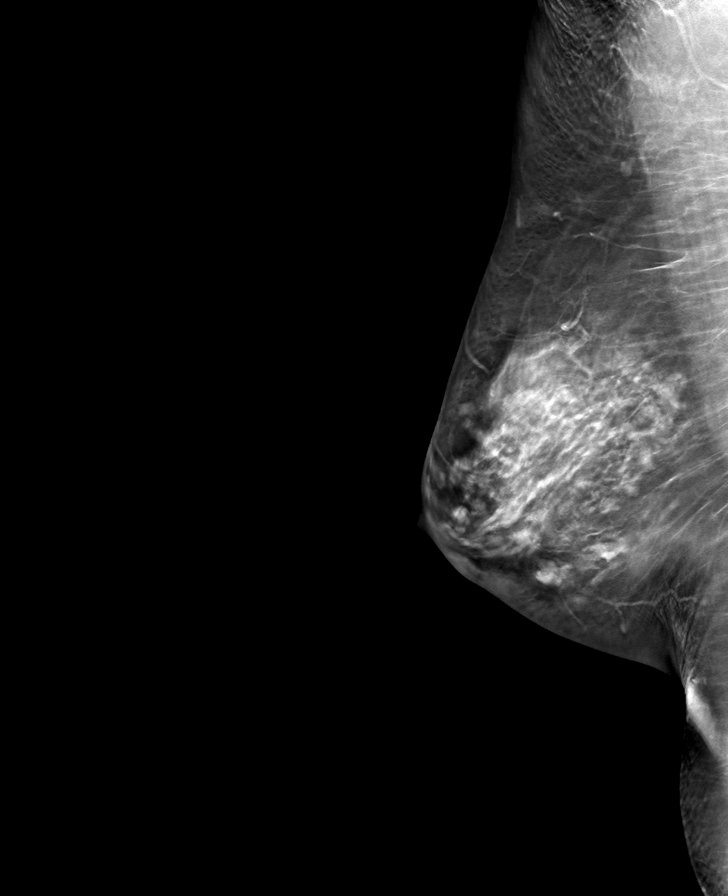

[L MLO tomo · tomo slice 37/72.0]
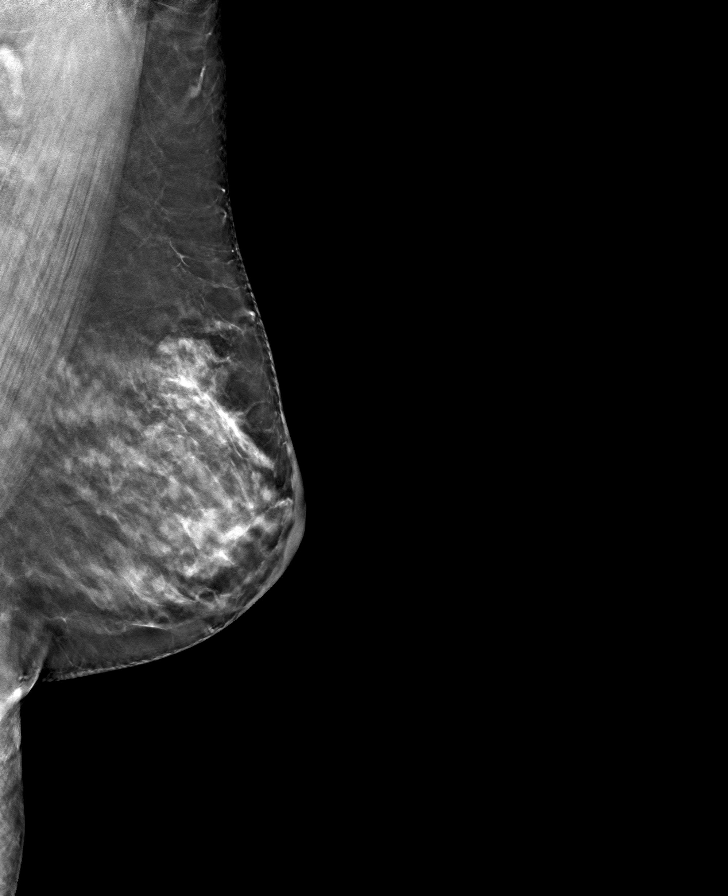

[8 of 24 positions shown; findings below may reference images not displayed]

ACR Breast Density Category c: The breast tissue is heterogeneously
dense, which may obscure small masses.
FINDINGS: There are no findings suspicious for malignancy. Images were
processed with CAD.
IMPRESSION: No mammographic evidence of malignancy. A result letter of this
screening mammogram will be mailed directly to the patient.

RECOMMENDATION:
Screening mammogram in one year. (Code:[id number]-U-LHB)

BI-RADS CATEGORY  1: Negative.

## 2020-08-24 ENCOUNTER — Encounter: Payer: Self-pay | Admitting: Family Medicine

## 2020-10-06 ENCOUNTER — Other Ambulatory Visit: Payer: Self-pay | Admitting: Family Medicine

## 2020-10-10 ENCOUNTER — Encounter: Payer: Self-pay | Admitting: Family Medicine

## 2020-10-10 MED ORDER — PROGESTERONE 200 MG PO CAPS: 200.0000 mg | ORAL_CAPSULE | Freq: Every day | ORAL | 0 refills | Status: DC

## 2020-10-10 MED ORDER — LOSARTAN POTASSIUM 50 MG PO TABS: 50.0000 mg | ORAL_TABLET | Freq: Every day | ORAL | 0 refills | Status: DC

## 2020-10-27 ENCOUNTER — Ambulatory Visit (INDEPENDENT_AMBULATORY_CARE_PROVIDER_SITE_OTHER): Payer: Managed Care, Other (non HMO) | Admitting: Family Medicine

## 2020-10-27 ENCOUNTER — Encounter: Payer: Self-pay | Admitting: Family Medicine

## 2020-10-27 VITALS — BP 145/84 | HR 72 | Ht 67.0 in | Wt 191.0 lb

## 2020-10-27 DIAGNOSIS — E038 Other specified hypothyroidism: Secondary | ICD-10-CM | POA: Diagnosis not present

## 2020-10-27 DIAGNOSIS — R7301 Impaired fasting glucose: Secondary | ICD-10-CM

## 2020-10-27 DIAGNOSIS — I10 Essential (primary) hypertension: Secondary | ICD-10-CM

## 2020-10-27 DIAGNOSIS — Z13220 Encounter for screening for lipoid disorders: Secondary | ICD-10-CM

## 2020-10-27 DIAGNOSIS — R0602 Shortness of breath: Secondary | ICD-10-CM

## 2020-10-27 DIAGNOSIS — R002 Palpitations: Secondary | ICD-10-CM

## 2020-10-27 DIAGNOSIS — R634 Abnormal weight loss: Secondary | ICD-10-CM

## 2020-10-27 LAB — POCT GLYCOSYLATED HEMOGLOBIN (HGB A1C): Hemoglobin A1C: 5.4 % (ref 4.0–5.6)

## 2020-10-27 NOTE — Assessment & Plan Note (Signed)
A1c looks phenomenal today at 5.4.  Continue current regimen plan to follow-up in 6 months.

## 2020-10-27 NOTE — Assessment & Plan Note (Signed)
I am suspicious her thyroid level may be off with the sudden weight loss heart pounding, muscle tension and shortness of breath.  We will recheck TSH as well as check for anemia.
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
## 2020-10-27 NOTE — Assessment & Plan Note (Signed)
Blood pressure still elevated at the end of the visit so can have her come back in about 2 weeks for nurse visit recheck normally blood pressures well controlled so maybe it is related to what is been going on with her.

## 2020-10-28 ENCOUNTER — Encounter: Payer: Self-pay | Admitting: Family Medicine

## 2020-10-28 LAB — COMPLETE METABOLIC PANEL WITH GFR
AG Ratio: 1.7 (calc) (ref 1.0–2.5)
ALT: 13 U/L (ref 6–29)
AST: 11 U/L (ref 10–35)
Albumin: 4.3 g/dL (ref 3.6–5.1)
Alkaline phosphatase (APISO): 50 U/L (ref 37–153)
BUN: 13 mg/dL (ref 7–25)
CO2: 28 mmol/L (ref 20–32)
Calcium: 9.6 mg/dL (ref 8.6–10.4)
Chloride: 105 mmol/L (ref 98–110)
Creat: 0.79 mg/dL (ref 0.50–1.03)
Globulin: 2.5 g/dL (calc) (ref 1.9–3.7)
Glucose, Bld: 95 mg/dL (ref 65–99)
Potassium: 3.9 mmol/L (ref 3.5–5.3)
Sodium: 141 mmol/L (ref 135–146)
Total Bilirubin: 0.3 mg/dL (ref 0.2–1.2)
Total Protein: 6.8 g/dL (ref 6.1–8.1)
eGFR: 87 mL/min/{1.73_m2} (ref >=60)

## 2020-10-28 LAB — CBC WITH DIFFERENTIAL/PLATELET
Absolute Monocytes: 383 cells/uL (ref 200–950)
Basophils Absolute: 48 cells/uL (ref 0–200)
Basophils Relative: 1.1 %
Eosinophils Absolute: 101 cells/uL (ref 15–500)
Eosinophils Relative: 2.3 %
HCT: 41.8 % (ref 35.0–45.0)
Hemoglobin: 13.7 g/dL (ref 11.7–15.5)
Lymphs Abs: 1976 cells/uL (ref 850–3900)
MCH: 30.5 pg (ref 27.0–33.0)
MCHC: 32.8 g/dL (ref 32.0–36.0)
MCV: 93.1 fL (ref 80.0–100.0)
MPV: 9.8 fL (ref 7.5–12.5)
Monocytes Relative: 8.7 %
Neutro Abs: 1892 cells/uL (ref 1500–7800)
Neutrophils Relative %: 43 %
Platelets: 247 10*3/uL (ref 140–400)
RBC: 4.49 10*6/uL (ref 3.80–5.10)
RDW: 12.5 % (ref 11.0–15.0)
Total Lymphocyte: 44.9 %
WBC: 4.4 10*3/uL (ref 3.8–10.8)

## 2020-10-28 LAB — T4, FREE: Free T4: 1.8 ng/dL (ref 0.8–1.8)

## 2020-10-28 LAB — LIPID PANEL W/REFLEX DIRECT LDL
Cholesterol: 164 mg/dL (ref <200)
HDL: 62 mg/dL (ref >=50)
LDL Cholesterol (Calc): 87 mg/dL (calc)
Non-HDL Cholesterol (Calc): 102 mg/dL (calc) (ref <130)
Total CHOL/HDL Ratio: 2.6 (calc) (ref <5.0)
Triglycerides: 64 mg/dL (ref <150)

## 2020-10-28 LAB — THYROID PEROXIDASE ANTIBODY: Thyroperoxidase Ab SerPl-aCnc: <1 IU/mL (ref <9)

## 2020-10-28 LAB — TSH+FREE T4: TSH W/REFLEX TO FT4: 0.21 mIU/L — ABNORMAL LOW (ref 0.40–4.50)

## 2020-10-28 MED ORDER — LEVOTHYROXINE SODIUM 100 MCG PO TABS: 100.0000 ug | ORAL_TABLET | Freq: Every day | ORAL | 0 refills | Status: DC

## 2020-10-28 NOTE — Addendum Note (Signed)
Addended by: Beatrice Lecher D on: 10/28/2020 12:13 PM   Modules accepted: Orders

## 2020-10-31 ENCOUNTER — Encounter: Payer: Self-pay | Admitting: Family Medicine

## 2020-11-03 NOTE — Addendum Note (Signed)
Addended by: Narda Rutherford on: 11/03/2020 09:18 AM   Modules accepted: Orders

## 2020-11-10 ENCOUNTER — Ambulatory Visit: Payer: Managed Care, Other (non HMO)

## 2020-11-11 ENCOUNTER — Ambulatory Visit: Payer: Managed Care, Other (non HMO)

## 2020-11-17 ENCOUNTER — Ambulatory Visit: Payer: Managed Care, Other (non HMO) | Admitting: Family Medicine

## 2020-11-18 ENCOUNTER — Ambulatory Visit (INDEPENDENT_AMBULATORY_CARE_PROVIDER_SITE_OTHER): Payer: Managed Care, Other (non HMO) | Admitting: Family Medicine

## 2020-11-18 ENCOUNTER — Other Ambulatory Visit: Payer: Self-pay

## 2020-11-18 VITALS — BP 116/74 | HR 75

## 2020-11-18 DIAGNOSIS — I10 Essential (primary) hypertension: Secondary | ICD-10-CM

## 2020-11-18 NOTE — Progress Notes (Signed)
Agree with documentation as above.   Shimon Trowbridge, MD  
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
## 2020-11-27 ENCOUNTER — Emergency Department
Admission: EM | Admit: 2020-11-27 | Discharge: 2020-11-27 | Disposition: A | Discharge status: Home / Self Care | Payer: Managed Care, Other (non HMO) | Source: Home / Self Care

## 2020-11-27 ENCOUNTER — Other Ambulatory Visit: Payer: Self-pay

## 2020-11-27 ENCOUNTER — Encounter: Payer: Self-pay | Admitting: Emergency Medicine

## 2020-11-27 DIAGNOSIS — L509 Urticaria, unspecified: Secondary | ICD-10-CM | POA: Diagnosis not present

## 2020-11-27 MED ORDER — PREDNISONE 10 MG (21) PO TBPK: ORAL_TABLET | Freq: Every day | ORAL | 0 refills | Status: DC

## 2020-11-27 NOTE — Discharge Instructions (Addendum)
Take 2 Zyrtec a day until rash clears May also take Benadryl at night if needed for itching or sleep Take the prednisone as directed.  Take all of day 1 today Consider an allergy referral if the rash persists or becomes recurrent
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
## 2020-11-27 NOTE — ED Triage Notes (Signed)
Red raised rash to trunk  Yardwork yesterday - noted rash last night at bra line  Slightly itchy  No OTC meds

## 2021-01-04 ENCOUNTER — Other Ambulatory Visit: Payer: Self-pay | Admitting: Family Medicine

## 2021-01-09 ENCOUNTER — Other Ambulatory Visit: Payer: Self-pay | Admitting: Family Medicine

## 2021-01-09 DIAGNOSIS — E038 Other specified hypothyroidism: Secondary | ICD-10-CM

## 2021-01-10 ENCOUNTER — Encounter: Payer: Self-pay | Admitting: Family Medicine

## 2021-01-10 DIAGNOSIS — Z1211 Encounter for screening for malignant neoplasm of colon: Secondary | ICD-10-CM

## 2021-01-19 ENCOUNTER — Other Ambulatory Visit: Payer: Self-pay | Admitting: Family Medicine

## 2021-01-19 LAB — COMPLETE METABOLIC PANEL WITH GFR
AG Ratio: 1.8 (calc) (ref 1.0–2.5)
ALT: 10 U/L (ref 6–29)
AST: 11 U/L (ref 10–35)
Albumin: 4.1 g/dL (ref 3.6–5.1)
Alkaline phosphatase (APISO): 53 U/L (ref 37–153)
BUN: 14 mg/dL (ref 7–25)
CO2: 25 mmol/L (ref 20–32)
Calcium: 9.5 mg/dL (ref 8.6–10.4)
Chloride: 109 mmol/L (ref 98–110)
Creat: 0.88 mg/dL (ref 0.50–1.03)
Globulin: 2.3 g/dL (calc) (ref 1.9–3.7)
Glucose, Bld: 88 mg/dL (ref 65–99)
Potassium: 4.3 mmol/L (ref 3.5–5.3)
Sodium: 143 mmol/L (ref 135–146)
Total Bilirubin: 0.3 mg/dL (ref 0.2–1.2)
Total Protein: 6.4 g/dL (ref 6.1–8.1)
eGFR: 76 mL/min/{1.73_m2} (ref >=60)

## 2021-01-19 LAB — LIPID PANEL
Cholesterol: 198 mg/dL (ref <200)
HDL: 66 mg/dL (ref >=50)
LDL Cholesterol (Calc): 115 mg/dL (calc) — ABNORMAL HIGH
Non-HDL Cholesterol (Calc): 132 mg/dL (calc) — ABNORMAL HIGH (ref <130)
Total CHOL/HDL Ratio: 3 (calc) (ref <5.0)
Triglycerides: 73 mg/dL (ref <150)

## 2021-01-19 LAB — TSH: TSH: 0.31 mIU/L — ABNORMAL LOW (ref 0.40–4.50)

## 2021-01-19 NOTE — Progress Notes (Signed)
Hi Kayla Taylor, your LDL is back up a little bit.  It looked better 2 months ago and the LDL was 87 and now it is back up to 115.  Just encourage her to continue to work on healthy diet and regular exercise.  The thyroid level is back down to 0.3 which is where you have been trending for the last couple of years.  Though interestingly 8 months ago it looked more normal.  But it is about the same as it was previously.  Your metabolic panel looks good.

## 2021-01-25 ENCOUNTER — Other Ambulatory Visit (HOSPITAL_BASED_OUTPATIENT_CLINIC_OR_DEPARTMENT_OTHER): Payer: Self-pay | Admitting: Family Medicine

## 2021-01-25 DIAGNOSIS — Z1231 Encounter for screening mammogram for malignant neoplasm of breast: Secondary | ICD-10-CM

## 2021-02-01 ENCOUNTER — Ambulatory Visit (INDEPENDENT_AMBULATORY_CARE_PROVIDER_SITE_OTHER): Payer: Managed Care, Other (non HMO)

## 2021-02-01 ENCOUNTER — Other Ambulatory Visit: Payer: Self-pay

## 2021-02-01 DIAGNOSIS — Z1231 Encounter for screening mammogram for malignant neoplasm of breast: Secondary | ICD-10-CM

## 2021-02-01 NOTE — Progress Notes (Signed)
Please call patient. Normal mammogram.  Repeat in 1 year.  

## 2021-02-10 ENCOUNTER — Encounter: Payer: Self-pay | Admitting: Family Medicine

## 2021-02-10 MED ORDER — PROGESTERONE 200 MG PO CAPS: 200.0000 mg | ORAL_CAPSULE | Freq: Every day | ORAL | 1 refills | Status: DC

## 2021-02-10 MED ORDER — ESTRADIOL 1 MG PO TABS: 1.0000 mg | ORAL_TABLET | Freq: Every day | ORAL | 1 refills | Status: DC

## 2021-02-28 ENCOUNTER — Ambulatory Visit: Payer: Managed Care, Other (non HMO)

## 2021-03-01 ENCOUNTER — Other Ambulatory Visit: Payer: Self-pay

## 2021-03-01 ENCOUNTER — Encounter: Payer: Self-pay | Admitting: Physician Assistant

## 2021-03-01 ENCOUNTER — Ambulatory Visit: Payer: Managed Care, Other (non HMO) | Admitting: Physician Assistant

## 2021-03-01 VITALS — BP 126/78 | HR 72 | Ht 67.0 in | Wt 210.0 lb

## 2021-03-01 DIAGNOSIS — B309 Viral conjunctivitis, unspecified: Secondary | ICD-10-CM | POA: Diagnosis not present

## 2021-03-01 MED ORDER — POLYMYXIN B-TRIMETHOPRIM 10000-0.1 UNIT/ML-% OP SOLN: 2.0000 [drp] | OPHTHALMIC | 0 refills | Status: DC

## 2021-03-01 NOTE — Patient Instructions (Signed)
Viral Conjunctivitis, Adult Viral conjunctivitis is an inflammation of the clear membrane that covers the white part of the eye and the inner surface of the eyelid (conjunctiva). The inflammation is caused by a viral infection. The blood vessels in the conjunctiva become enlarged, causing the eye to become red or pink and often itchy. It usually starts in one eye and goes to the other in a day or two. Infections often resolve over 1-2 weeks. Viral conjunctivitis is contagious. This means it can be easily passed from one person to another. This condition is often called pink eye. What are the causes? This condition is caused by a virus. It can be spread by touching objects that have been contaminated with the virus, such as doorknobs or towels, and then touching your eye. It can also be passed through tiny droplets, such as from coughing or sneezing. What increases the risk? You are more likely to develop this condition if you have a cold or the flu, or are in close contact with a person with pink eye. What are the signs or symptoms? Symptoms of this condition include: Redness in the eye. Tearing or watery eyes. Itchy and irritated eyes. Burning feeling in the eyes. Clear drainage from the eye. Swollen eyelids. A gritty feeling in the eye. Light sensitivity. This condition often occurs with other symptoms, such as nasal congestion, cough, and fever. How is this diagnosed? This condition is diagnosed with a medical history and physical exam. If you have discharge from your eye, the discharge may be tested to rule out other causes of conjunctivitis. How is this treated? Viral conjunctivitis does not respond to medicines that kill bacteria (antibiotics). The condition most often resolves on its own in 1-2 weeks. If treatment is needed, it is aimed at relieving your symptoms and preventing the spread of infection. This may be done with artificial tear drops, antihistamine drops, or other eye  medicines. In rare cases, steroid eye drops or anti-herpes virus medicines may be prescribed. Follow these instructions at home: Medicines  Take or apply over-the-counter and prescription medicines only as told by your health care provider. Do not touch the edge of the eyelid with the eye-drop bottle or ointment tube when applying medicines to the affected eye. This will prevent the spread of the infection to the other eye or to other people. Eye care Avoid touching or rubbing your eyes. Apply a clean, cool, wet washcloth onto your eye for 10-20 minutes, 3-4 times per day, or as told by your health care provider. If you wear contact lenses, do not wear them until the inflammation is gone and your health care provider says it is safe to wear them again. Ask your health care provider how to disinfect or replace your contact lenses before using them again. Wear glasses until you can resume wearing contacts. Avoid wearing eye makeup until the inflammation is gone. Throw away any old eye cosmetics that may be contaminated. Gently wipe away any crusting from your eye with a wet washcloth or a cotton ball. General instructions Change or wash your pillowcase every day or as told by your health care provider. Do not share towels, pillowcases, washcloths, eye makeup, makeup brushes, contact lenses, or eyeglasses. This may spread the infection. Wash your hands often with soap and water. Use paper towels to dry your hands. If soap and water are not available, use hand sanitizer. Avoid contact with other people until your eye is no longer red and tearing, or as told by  your health care provider. Contact a health care provider if: Your symptoms do not improve with treatment, or they get worse. You have increased pain. Your vision becomes blurry. You have a fever. You have facial pain, redness, or swelling. You have yellow or green drainage coming from your eye. You have new symptoms. Get help right away  if: You develop severe pain. Your vision gets much worse. Summary Viral conjunctivitis is an inflammation of the clear membrane that covers the white part of the eye and the inner surface of the eyelid. It usually goes away in 1-2 weeks. This condition is usually treated with medicines and cold compresses. Treatment focuses on relieving the symptoms. This condition is very contagious. To prevent infection, avoid close contact with others, wash your hands often, and do not share towels or washcloths. Contact a health care provider if your symptoms do not go away with treatment, or if you have more pain, poor vision, or swelling in the eyes. Get help right away if you have severe pain or your vision gets much worse. This information is not intended to replace advice given to you by your health care provider. Make sure you discuss any questions you have with your health care provider. Document Revised: 01/26/2019 Document Reviewed: 01/09/2019 Elsevier Patient Education  2022 [employer].
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
## 2021-04-10 ENCOUNTER — Other Ambulatory Visit: Payer: Self-pay | Admitting: Family Medicine

## 2021-04-10 DIAGNOSIS — E038 Other specified hypothyroidism: Secondary | ICD-10-CM

## 2021-05-02 ENCOUNTER — Ambulatory Visit: Payer: Managed Care, Other (non HMO) | Admitting: Family Medicine

## 2021-05-02 ENCOUNTER — Encounter: Payer: Self-pay | Admitting: Family Medicine

## 2021-05-02 ENCOUNTER — Other Ambulatory Visit: Payer: Self-pay

## 2021-05-02 VITALS — BP 131/62 | HR 87 | Resp 16 | Ht 67.0 in | Wt 205.0 lb

## 2021-05-02 DIAGNOSIS — Z780 Asymptomatic menopausal state: Secondary | ICD-10-CM | POA: Diagnosis not present

## 2021-05-02 DIAGNOSIS — I10 Essential (primary) hypertension: Secondary | ICD-10-CM | POA: Diagnosis not present

## 2021-05-02 DIAGNOSIS — R7301 Impaired fasting glucose: Secondary | ICD-10-CM

## 2021-05-02 DIAGNOSIS — E038 Other specified hypothyroidism: Secondary | ICD-10-CM | POA: Diagnosis not present

## 2021-05-02 LAB — POCT GLYCOSYLATED HEMOGLOBIN (HGB A1C): Hemoglobin A1C: 5.4 % (ref 4.0–5.6)

## 2021-05-02 LAB — TSH: TSH: 0.14 mIU/L — ABNORMAL LOW (ref 0.40–4.50)

## 2021-05-02 MED ORDER — LEVOTHYROXINE SODIUM 100 MCG PO TABS: ORAL_TABLET | ORAL | 1 refills | Status: DC

## 2021-05-02 MED ORDER — PROGESTERONE 200 MG PO CAPS: 200.0000 mg | ORAL_CAPSULE | Freq: Every day | ORAL | 1 refills | Status: DC

## 2021-05-02 NOTE — Assessment & Plan Note (Signed)
Well controlled. Continue current regimen. Follow up in  6 mo  
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
## 2021-05-02 NOTE — Assessment & Plan Note (Signed)
Due to recheck TSH. 

## 2021-05-03 ENCOUNTER — Encounter: Payer: Self-pay | Admitting: Family Medicine

## 2021-05-03 NOTE — Progress Notes (Signed)
Hi Junella, I like to make a slight adjustment to your thyroid medication.  Instead of taking a whole tab daily and like you to take a half a tab on Sundays and a whole tab the other 6 days a week.  Then repeat this pattern each week and then lets plan to recheck it again in about 2 months.  You are still looking a little overmedicated.

## 2021-06-24 LAB — COLOGUARD

## 2021-06-26 NOTE — Progress Notes (Signed)
Hi Kayla Taylor, Cologuard let us know that they did not have enough stool to do full testing.  They will be contacting you and sending out another kit.  If you are able to get another specimen that would be wonderful.

## 2021-07-05 ENCOUNTER — Other Ambulatory Visit: Payer: Self-pay | Admitting: *Deleted

## 2021-07-05 ENCOUNTER — Telehealth: Payer: Self-pay | Admitting: Family Medicine

## 2021-07-05 DIAGNOSIS — E038 Other specified hypothyroidism: Secondary | ICD-10-CM

## 2021-07-05 NOTE — Telephone Encounter (Signed)
Per patient she is supposed to come back to have her Thyroid test again. Please advise.  ?

## 2021-07-05 NOTE — Telephone Encounter (Signed)
Called and informed pt that lab was placed.  ?

## 2021-07-25 ENCOUNTER — Other Ambulatory Visit: Payer: Self-pay | Admitting: Family Medicine

## 2021-07-25 LAB — COLOGUARD: COLOGUARD: NEGATIVE

## 2021-07-26 NOTE — Progress Notes (Signed)
Great news! Your Cologuard test is negative.  Recommend repeat colon cancer screening in 3 years.

## 2021-07-28 LAB — TSH: TSH: 0.89 mIU/L (ref 0.40–4.50)

## 2021-07-28 NOTE — Progress Notes (Signed)
Hi Demetri, I think we are finally there!  TSH looks much better and is back into the normal range.  So lets continue with her current regimen and then plan to recheck it again in about 6 months just to make sure that it is stable.

## 2021-10-30 ENCOUNTER — Encounter: Payer: Self-pay | Admitting: Family Medicine

## 2021-10-30 ENCOUNTER — Ambulatory Visit: Payer: Managed Care, Other (non HMO) | Admitting: Family Medicine

## 2021-10-30 VITALS — BP 151/77 | HR 81 | Wt 198.0 lb

## 2021-10-30 DIAGNOSIS — I10 Essential (primary) hypertension: Secondary | ICD-10-CM | POA: Diagnosis not present

## 2021-10-30 DIAGNOSIS — E038 Other specified hypothyroidism: Secondary | ICD-10-CM

## 2021-10-30 DIAGNOSIS — R7301 Impaired fasting glucose: Secondary | ICD-10-CM | POA: Diagnosis not present

## 2021-10-30 MED ORDER — LEVOTHYROXINE SODIUM 100 MCG PO TABS: ORAL_TABLET | ORAL | 1 refills | Status: DC

## 2021-10-30 NOTE — Assessment & Plan Note (Signed)
Due to recheck thyroid level.  Last level looked pretty good but it was still less than 1 so we may still need to make a minor adjustment.

## 2021-10-30 NOTE — Assessment & Plan Note (Signed)
BP Not at goal today.  She will monitor blood pressures at home and send me some information through [place].  She says ultimately her goal over the next year by age 61 is to get off of her blood pressure medication if at all possible.  We discussed continuing to work on healthy food choices, really incorporating that routine regular exercise which can lower blood pressure by 5-10 points again she is already started exercising.  And continue to work on weight loss.  She is down about 12 pounds from a year ago which is absolutely phenomenal and I think if she can continue with that path I do believe it is possible that we might be able to either stop or at least reduce the blood pressure medication

## 2021-10-30 NOTE — Assessment & Plan Note (Signed)
To recheck A1c she really has done a great job over the last year and controlling her blood sugars and is really cut out a lot of sweets.  In fact she says they do not even really taste the same anymore.  And so it is also been easier.

## 2021-10-30 NOTE — Progress Notes (Signed)
Established Patient Office Visit  Subjective   Patient ID: Kayla Taylor, female    DOB: 06-02-1960  Age: 61 y.o. MRN: [id number]  Chief Complaint  Patient presents with   Hypertension   Hypothyroidism    HPI  Hypertension- Pt denies chest pain, SOB, dizziness, or heart palpitations.  Taking meds as directed w/o problems.  Denies medication side effects.    Hypothyroidism - Taking medication regularly in the AM away from food and vitamins, etc. No recent change to skin, hair, or energy levels.  Down about 5 lbs sinc last here. Has been trying to incorporate exercise.  She has been using some rate and 5 pound dumbbells and kettle bells for exercises and doing some squats.  Impaired fasting glucose-no increased thirst or urination. No symptoms consistent with hypoglycemia.     ROS    Objective:     BP (!) 151/77   Pulse 81   Wt 198 lb (89.8 kg)   LMP 04/29/2016   BMI 31.01 kg/m    Physical Exam Vitals and nursing note reviewed.  Constitutional:      Appearance: She is well-developed.  HENT:     Head: Normocephalic and atraumatic.  Cardiovascular:     Rate and Rhythm: Normal rate and regular rhythm.     Heart sounds: Normal heart sounds.  Pulmonary:     Effort: Pulmonary effort is normal.     Breath sounds: Normal breath sounds.  Skin:    General: Skin is warm and dry.  Neurological:     Mental Status: She is alert and oriented to person, place, and time.  Psychiatric:        Behavior: Behavior normal.      No results found for any visits on 10/30/21.    The 10-year ASCVD risk score (Arnett DK, et al., 2019) is: 4.8%    Assessment & Plan:   Problem List Items Addressed This Visit       Cardiovascular and Mediastinum   Essential hypertension - Primary    BP Not at goal today.  She will monitor blood pressures at home and send me some information through [place].  She says ultimately her goal over the next year by age 54 is to get off of her  blood pressure medication if at all possible.  We discussed continuing to work on healthy food choices, really incorporating that routine regular exercise which can lower blood pressure by 5-10 points again she is already started exercising.  And continue to work on weight loss.  She is down about 12 pounds from a year ago which is absolutely phenomenal and I think if she can continue with that path I do believe it is possible that we might be able to either stop or at least reduce the blood pressure medication      Relevant Orders   COMPLETE METABOLIC PANEL WITH GFR   Hemoglobin A1c     Endocrine   IFG (impaired fasting glucose)    To recheck A1c she really has done a great job over the last year and controlling her blood sugars and is really cut out a lot of sweets.  In fact she says they do not even really taste the same anymore.  And so it is also been easier.      Relevant Orders   Hemoglobin A1c   Hypothyroidism    Due to recheck thyroid level.  Last level looked pretty good but it was still less than 1 so we  may still need to make a minor adjustment.      Relevant Medications   levothyroxine (SYNTHROID) 100 MCG tablet   Other Relevant Orders   TSH    Return in about 6 months (around 05/02/2022) for thyroid and BP check. Beatrice Lecher, MD

## 2021-10-31 LAB — COMPLETE METABOLIC PANEL WITH GFR
AG Ratio: 1.8 (calc) (ref 1.0–2.5)
ALT: 14 U/L (ref 6–29)
AST: 15 U/L (ref 10–35)
Albumin: 4.4 g/dL (ref 3.6–5.1)
Alkaline phosphatase (APISO): 53 U/L (ref 37–153)
BUN: 13 mg/dL (ref 7–25)
CO2: 25 mmol/L (ref 20–32)
Calcium: 9.5 mg/dL (ref 8.6–10.4)
Chloride: 105 mmol/L (ref 98–110)
Creat: 0.87 mg/dL (ref 0.50–1.03)
Globulin: 2.5 g/dL (calc) (ref 1.9–3.7)
Glucose, Bld: 99 mg/dL (ref 65–139)
Potassium: 4.4 mmol/L (ref 3.5–5.3)
Sodium: 140 mmol/L (ref 135–146)
Total Bilirubin: 0.3 mg/dL (ref 0.2–1.2)
Total Protein: 6.9 g/dL (ref 6.1–8.1)
eGFR: 77 mL/min/{1.73_m2} (ref >=60)

## 2021-10-31 LAB — HEMOGLOBIN A1C
Hgb A1c MFr Bld: 5.6 % of total Hgb (ref <5.7)
Mean Plasma Glucose: 114 mg/dL
eAG (mmol/L): 6.3 mmol/L

## 2021-10-31 LAB — TSH: TSH: 1.11 mIU/L (ref 0.40–4.50)

## 2021-10-31 NOTE — Progress Notes (Signed)
Hi Kayla Taylor, your thyroid level looks perfect at this time!  Fact all of your labs look great!  Just try to check your blood pressure at home if you are able and would still like to have you come back in a couple of weeks for nurse visit just to make sure that it is coming back down.

## 2021-11-17 LAB — HM PAP SMEAR: HM Pap smear: NEGATIVE

## 2022-01-01 ENCOUNTER — Other Ambulatory Visit: Payer: Self-pay | Admitting: Family Medicine

## 2022-02-22 ENCOUNTER — Other Ambulatory Visit: Payer: Self-pay | Admitting: Family Medicine

## 2022-02-22 DIAGNOSIS — Z780 Asymptomatic menopausal state: Secondary | ICD-10-CM

## 2022-05-07 ENCOUNTER — Ambulatory Visit: Payer: Managed Care, Other (non HMO) | Admitting: Family Medicine

## 2022-05-07 ENCOUNTER — Encounter: Payer: Self-pay | Admitting: Family Medicine

## 2022-05-07 ENCOUNTER — Other Ambulatory Visit: Payer: Self-pay | Admitting: Family Medicine

## 2022-05-07 VITALS — BP 135/79 | HR 86 | Ht 67.0 in | Wt 202.0 lb

## 2022-05-07 DIAGNOSIS — E038 Other specified hypothyroidism: Secondary | ICD-10-CM

## 2022-05-07 DIAGNOSIS — E785 Hyperlipidemia, unspecified: Secondary | ICD-10-CM

## 2022-05-07 DIAGNOSIS — Z23 Encounter for immunization: Secondary | ICD-10-CM | POA: Diagnosis not present

## 2022-05-07 DIAGNOSIS — I10 Essential (primary) hypertension: Secondary | ICD-10-CM

## 2022-05-07 DIAGNOSIS — R7301 Impaired fasting glucose: Secondary | ICD-10-CM

## 2022-05-07 DIAGNOSIS — Z1231 Encounter for screening mammogram for malignant neoplasm of breast: Secondary | ICD-10-CM

## 2022-05-07 LAB — POCT GLYCOSYLATED HEMOGLOBIN (HGB A1C): Hemoglobin A1C: 5.7 % — AB (ref 4.0–5.6)

## 2022-05-07 MED ORDER — ESTRADIOL 1 MG PO TABS: 1.0000 mg | ORAL_TABLET | Freq: Every day | ORAL | 3 refills | Status: DC

## 2022-05-07 MED ORDER — LEVOTHYROXINE SODIUM 100 MCG PO TABS: ORAL_TABLET | ORAL | 3 refills | Status: DC

## 2022-05-07 NOTE — Assessment & Plan Note (Signed)
Well controlled. Continue current regimen. Follow up in  6 mo  

## 2022-05-07 NOTE — Assessment & Plan Note (Signed)
Due to recheck levels

## 2022-05-07 NOTE — Progress Notes (Signed)
Established Patient Office Visit  Subjective   Patient ID: Kayla Taylor, female    DOB: 12-19-1960  Age: 62 y.o. MRN: [id number]  Chief Complaint  Patient presents with   Hypertension   Hypothyroidism   ifg    HPI  Hypertension- Pt denies chest pain, SOB, dizziness, or heart palpitations.  Taking meds as directed w/o problems.  Denies medication side effects.    Impaired fasting glucose-no increased thirst or urination. No symptoms consistent with hypoglycemia.  Hypothyroidism - Taking medication regularly in the AM away from food and vitamins, etc. No recent change to skin, hair, or energy levels.  Working from home now. Has been trying to exercise at her apt complex, has a gym.     ROS    Objective:     BP 135/79   Pulse 86   Ht '5\' 7"'$  (1.702 m)   Wt 202 lb (91.6 kg)   LMP 04/29/2016   SpO2 99%   BMI 31.64 kg/m    Physical Exam Vitals and nursing note reviewed.  Constitutional:      Appearance: She is well-developed.  HENT:     Head: Normocephalic and atraumatic.  Cardiovascular:     Rate and Rhythm: Normal rate and regular rhythm.     Heart sounds: Normal heart sounds.  Pulmonary:     Effort: Pulmonary effort is normal.     Breath sounds: Normal breath sounds.  Skin:    General: Skin is warm and dry.  Neurological:     Mental Status: She is alert and oriented to person, place, and time.  Psychiatric:        Behavior: Behavior normal.      Results for orders placed or performed in visit on 05/07/22  POCT glycosylated hemoglobin (Hb A1C)  Result Value Ref Range   Hemoglobin A1C 5.7 (A) 4.0 - 5.6 %   HbA1c POC (<> result, manual entry)     HbA1c, POC (prediabetic range)     HbA1c, POC (controlled diabetic range)        The 10-year ASCVD risk score (Arnett DK, et al., 2019) is: 3.9%    Assessment & Plan:   Problem List Items Addressed This Visit       Cardiovascular and Mediastinum   Essential hypertension - Primary    Well  controlled. Continue current regimen. Follow up in 6 mo       Relevant Orders   BASIC METABOLIC PANEL WITH GFR   TSH   Lipid Panel w/reflex Direct LDL     Endocrine   IFG (impaired fasting glucose)    Well controlled. Continue current regimen. Follow up in  6 mo       Relevant Orders   BASIC METABOLIC PANEL WITH GFR   TSH   Lipid Panel w/reflex Direct LDL   POCT glycosylated hemoglobin (Hb A1C) (Completed)   Hypothyroidism    Due to recheck levels      Relevant Medications   levothyroxine (SYNTHROID) 100 MCG tablet   Other Relevant Orders   BASIC METABOLIC PANEL WITH GFR   TSH   Lipid Panel w/reflex Direct LDL     Other   Hyperlipidemia    Due to recheck lipids.        Relevant Orders   BASIC METABOLIC PANEL WITH GFR   TSH   Lipid Panel w/reflex Direct LDL   Other Visit Diagnoses     Need for tetanus, diphtheria, and acellular pertussis (Tdap) vaccine in patient of adolescent  age or older       Relevant Orders   Tdap vaccine greater than or equal to 7yo IM (Completed)       Return in about 6 months (around 11/05/2022) for bp/thyroid.    Beatrice Lecher, MD

## 2022-05-07 NOTE — Assessment & Plan Note (Signed)
Due to recheck lipids. 

## 2022-05-08 LAB — LIPID PANEL W/REFLEX DIRECT LDL
Cholesterol: 208 mg/dL — ABNORMAL HIGH (ref <200)
HDL: 74 mg/dL (ref >=50)
LDL Cholesterol (Calc): 112 mg/dL (calc) — ABNORMAL HIGH
Non-HDL Cholesterol (Calc): 134 mg/dL (calc) — ABNORMAL HIGH (ref <130)
Total CHOL/HDL Ratio: 2.8 (calc) (ref <5.0)
Triglycerides: 116 mg/dL (ref <150)

## 2022-05-08 LAB — BASIC METABOLIC PANEL WITH GFR
BUN: 14 mg/dL (ref 7–25)
CO2: 26 mmol/L (ref 20–32)
Calcium: 9.3 mg/dL (ref 8.6–10.4)
Chloride: 106 mmol/L (ref 98–110)
Creat: 0.84 mg/dL (ref 0.50–1.03)
Glucose, Bld: 97 mg/dL (ref 65–99)
Potassium: 4.3 mmol/L (ref 3.5–5.3)
Sodium: 141 mmol/L (ref 135–146)
eGFR: 80 mL/min/{1.73_m2} (ref >=60)

## 2022-05-08 LAB — TSH: TSH: 1.98 mIU/L (ref 0.40–4.50)

## 2022-05-09 NOTE — Progress Notes (Signed)
Hi Clair, LDL cholesterol is just slightly elevated at 112.  Similar to last year but not as good as a year ago just continue to work on healthy diet and regular exercise to bring that down.  Your thyroid and metabolic panel look good oriented

## 2022-05-11 ENCOUNTER — Encounter: Payer: Self-pay | Admitting: Family Medicine

## 2022-06-06 ENCOUNTER — Ambulatory Visit: Payer: Managed Care, Other (non HMO)

## 2022-06-06 DIAGNOSIS — Z1231 Encounter for screening mammogram for malignant neoplasm of breast: Secondary | ICD-10-CM

## 2022-06-07 NOTE — Progress Notes (Signed)
Please call patient. Normal mammogram.  Repeat in 1 year.  

## 2022-11-05 ENCOUNTER — Ambulatory Visit: Payer: Managed Care, Other (non HMO) | Admitting: Family Medicine

## 2022-11-27 ENCOUNTER — Ambulatory Visit: Payer: Managed Care, Other (non HMO) | Admitting: Family Medicine

## 2022-11-27 ENCOUNTER — Encounter: Payer: Self-pay | Admitting: Family Medicine

## 2022-11-27 VITALS — BP 100/72 | HR 79 | Ht 67.0 in | Wt 202.0 lb

## 2022-11-27 DIAGNOSIS — I10 Essential (primary) hypertension: Secondary | ICD-10-CM

## 2022-11-27 DIAGNOSIS — I8393 Asymptomatic varicose veins of bilateral lower extremities: Secondary | ICD-10-CM | POA: Diagnosis not present

## 2022-11-27 DIAGNOSIS — R7301 Impaired fasting glucose: Secondary | ICD-10-CM

## 2022-11-27 DIAGNOSIS — E038 Other specified hypothyroidism: Secondary | ICD-10-CM

## 2022-11-27 DIAGNOSIS — L539 Erythematous condition, unspecified: Secondary | ICD-10-CM

## 2022-11-27 MED ORDER — LOSARTAN POTASSIUM 50 MG PO TABS: 50.0000 mg | ORAL_TABLET | Freq: Every day | ORAL | 3 refills | Status: DC

## 2022-11-27 NOTE — Assessment & Plan Note (Signed)
Due for TSH check. Asymptomatic.

## 2022-11-27 NOTE — Assessment & Plan Note (Signed)
Due to check A1C. She is doing well.

## 2022-11-27 NOTE — Addendum Note (Signed)
Addended by: Nani Gasser D on: 11/27/2022 08:50 AM   Modules accepted: Orders

## 2022-11-27 NOTE — Assessment & Plan Note (Signed)
Well controlled. Continue current regimen. Follow up in  6mo due for labs

## 2022-11-27 NOTE — Progress Notes (Addendum)
Established Patient Office Visit  Subjective   Patient ID: Kayla Taylor, female    DOB: 1960-12-06  Age: 62 y.o. MRN: [id number]  Chief Complaint  Patient presents with   Hypertension    HPI  Hypertension- Pt denies chest pain, SOB, dizziness, or heart palpitations.  Taking meds as directed w/o problems.  Denies medication side effects.    Hypothyroidism - Taking medication regularly in the AM away from food and vitamins, etc. No recent change to skin, hair, or energy levels.  Impaired fasting glucose-no increased thirst or urination. No symptoms consistent with hypoglycemia.  Having some issues with veins and bruising on her right feet.  She says stared as a red spot on her right medial foot and then spread across towards her toes and then across tops of toes.     ROS    Objective:     BP 100/72   Pulse 79   Ht 5\' 7"  (1.702 m)   Wt 202 lb (91.6 kg)   LMP 04/29/2016   SpO2 98%   BMI 31.64 kg/m    Physical Exam Vitals and nursing note reviewed.  Constitutional:      Appearance: Normal appearance.  HENT:     Head: Normocephalic and atraumatic.  Eyes:     Conjunctiva/sclera: Conjunctivae normal.  Cardiovascular:     Rate and Rhythm: Normal rate and regular rhythm.  Pulmonary:     Effort: Pulmonary effort is normal.     Breath sounds: Normal breath sounds.  Skin:    General: Skin is warm and dry.     Comments: Redness that blanches along medial foot on right. Some across her toes as well.   Neurological:     Mental Status: She is alert.  Psychiatric:        Mood and Affect: Mood normal.     No results found for any visits on 11/27/22.    The 10-year ASCVD risk score (Arnett DK, et al., 2019) is: 2.3%    Assessment & Plan:   Problem List Items Addressed This Visit       Cardiovascular and Mediastinum   Essential hypertension - Primary    Well controlled. Continue current regimen. Follow up in  68mo due for labs       Relevant Medications    losartan (COZAAR) 50 MG tablet   Other Relevant Orders   CMP14+EGFR   TSH   Hemoglobin A1c     Endocrine   IFG (impaired fasting glucose)    Due to check A1C. She is doing well.       Relevant Orders   CMP14+EGFR   TSH   Hemoglobin A1c   Hypothyroidism    Due for TSH check. Asymptomatic.       Relevant Orders   CMP14+EGFR   TSH   Hemoglobin A1c   Other Visit Diagnoses     Varicose veins of both lower extremities, unspecified whether complicated       Relevant Medications   losartan (COZAAR) 50 MG tablet   Other Relevant Orders   Ambulatory referral to Vascular Surgery   Redness of skin       Relevant Orders   Ambulatory referral to Vascular Surgery       Return in about 6 months (around 05/27/2023) for Hypertension.    Nani Gasser, MD

## 2022-11-28 NOTE — Progress Notes (Signed)
Hi Barba, A1c is 5.7 still just borderline for the prediabetes range.  Continue to work on healthy food choices and cutting back on sweets and carbs.  Metabolic panel looks great.  Thyroid is normal.

## 2022-11-30 ENCOUNTER — Telehealth: Payer: Self-pay | Admitting: Family Medicine

## 2022-11-30 NOTE — Telephone Encounter (Signed)
error 

## 2023-01-20 ENCOUNTER — Encounter: Payer: Self-pay | Admitting: Family Medicine

## 2023-01-20 DIAGNOSIS — Z780 Asymptomatic menopausal state: Secondary | ICD-10-CM

## 2023-01-21 MED ORDER — PROGESTERONE 200 MG PO CAPS: 200.0000 mg | ORAL_CAPSULE | Freq: Every day | ORAL | 3 refills | Status: DC

## 2023-05-27 ENCOUNTER — Ambulatory Visit: Payer: Managed Care, Other (non HMO) | Admitting: Family Medicine

## 2023-05-27 ENCOUNTER — Other Ambulatory Visit: Payer: Self-pay | Admitting: Family Medicine

## 2023-05-27 ENCOUNTER — Encounter: Payer: Self-pay | Admitting: Family Medicine

## 2023-05-27 VITALS — BP 127/74 | HR 76 | Ht 67.0 in | Wt 201.0 lb

## 2023-05-27 DIAGNOSIS — R7301 Impaired fasting glucose: Secondary | ICD-10-CM | POA: Diagnosis not present

## 2023-05-27 DIAGNOSIS — Z780 Asymptomatic menopausal state: Secondary | ICD-10-CM

## 2023-05-27 DIAGNOSIS — E785 Hyperlipidemia, unspecified: Secondary | ICD-10-CM | POA: Diagnosis not present

## 2023-05-27 DIAGNOSIS — I10 Essential (primary) hypertension: Secondary | ICD-10-CM

## 2023-05-27 DIAGNOSIS — E038 Other specified hypothyroidism: Secondary | ICD-10-CM | POA: Diagnosis not present

## 2023-05-27 DIAGNOSIS — Z1231 Encounter for screening mammogram for malignant neoplasm of breast: Secondary | ICD-10-CM

## 2023-05-27 MED ORDER — ESTRADIOL 2 MG PO TABS: 2.0000 mg | ORAL_TABLET | Freq: Every day | ORAL | 1 refills | Status: DC

## 2023-05-27 MED ORDER — LEVOTHYROXINE SODIUM 100 MCG PO TABS: ORAL_TABLET | ORAL | 3 refills | Status: AC

## 2023-05-27 NOTE — Progress Notes (Signed)
   Established Patient Office Visit  Subjective  Patient ID: Kayla Taylor, female    DOB: 1960-05-24  Age: 63 y.o. MRN: [id number]  Chief Complaint  Patient presents with   Hypertension   Hyperlipidemia   ifg    HPI  6 mo f/u - wants to discuss hot flashes.  She has had more mild to moderate hot flashes recently and wants to know if she can adjust her estrogen.  She is to be on a higher dose.  She is also been exercising 3-4 times a week for about 45 minutes she has a small gym at her apartment complex and has been taking advantage of that.  She says she actually looks forward to exercising.    ROS    Objective:     BP 127/74   Pulse 76   Ht 5\' 7"  (1.702 m)   Wt 201 lb (91.2 kg)   LMP 04/29/2016   SpO2 98%   BMI 31.48 kg/m    Physical Exam Vitals and nursing note reviewed.  Constitutional:      Appearance: Normal appearance.  HENT:     Head: Normocephalic and atraumatic.  Eyes:     Conjunctiva/sclera: Conjunctivae normal.  Cardiovascular:     Rate and Rhythm: Normal rate and regular rhythm.  Pulmonary:     Effort: Pulmonary effort is normal.     Breath sounds: Normal breath sounds.  Skin:    General: Skin is warm and dry.  Neurological:     Mental Status: She is alert.  Psychiatric:        Mood and Affect: Mood normal.      No results found for any visits on 05/27/23.    The 10-year ASCVD risk score (Arnett DK, et al., 2019) is: 3.7%    Assessment & Plan:   Problem List Items Addressed This Visit       Cardiovascular and Mediastinum   Essential hypertension - Primary   Pressure at goal looks fantastic today.  Will get updated lab work.      Relevant Orders   HgB A1c   TSH   Lipid Panel With LDL/HDL Ratio   BMP8+EGFR     Endocrine   IFG (impaired fasting glucose)   Due for A1C. Doing great with working out regularly.        Relevant Orders   HgB A1c   TSH   Lipid Panel With LDL/HDL Ratio   BMP8+EGFR   Hypothyroidism    Hypothyroidism - Taking medication regularly in the AM away from food and vitamins, etc. No recent change to skin, hair, or energy levels. Recheck TSH      Relevant Medications   levothyroxine (SYNTHROID) 100 MCG tablet   Other Relevant Orders   HgB A1c   TSH   Lipid Panel With LDL/HDL Ratio   BMP8+EGFR     Other   Postmenopausal   Says she is having more mild to moderate hot flashes. She would like to go back up on estradiol. Will inc to 2mg  can try alternating bt 1 and 2mg  every other day.       Relevant Medications   estradiol (ESTRACE) 2 MG tablet   Hyperlipidemia   Relevant Orders   HgB A1c   TSH   Lipid Panel With LDL/HDL Ratio   BMP8+EGFR    Return in about 6 months (around 11/27/2023) for Hypertension, Pre-diabetes.    Nani Gasser, MD

## 2023-05-27 NOTE — Assessment & Plan Note (Addendum)
 Due for A1C. Doing great with working out regularly.

## 2023-05-27 NOTE — Assessment & Plan Note (Signed)
 Says she is having more mild to moderate hot flashes. She would like to go back up on estradiol. Will inc to 2mg  can try alternating bt 1 and 2mg  every other day.

## 2023-05-27 NOTE — Assessment & Plan Note (Signed)
 Pressure at goal looks fantastic today.  Will get updated lab work.

## 2023-05-27 NOTE — Assessment & Plan Note (Signed)
 Hypothyroidism - Taking medication regularly in the AM away from food and vitamins, etc. No recent change to skin, hair, or energy levels. Recheck TSH

## 2023-05-28 ENCOUNTER — Encounter: Payer: Self-pay | Admitting: Family Medicine

## 2023-05-28 LAB — BMP8+EGFR
BUN/Creatinine Ratio: 12 (ref 12–28)
BUN: 11 mg/dL (ref 8–27)
CO2: 21 mmol/L (ref 20–29)
Calcium: 9.2 mg/dL (ref 8.7–10.3)
Chloride: 106 mmol/L (ref 96–106)
Creatinine, Ser: 0.89 mg/dL (ref 0.57–1.00)
Glucose: 92 mg/dL (ref 70–99)
Potassium: 3.8 mmol/L (ref 3.5–5.2)
Sodium: 140 mmol/L (ref 134–144)
eGFR: 74 mL/min/{1.73_m2} (ref >59)

## 2023-05-28 LAB — LIPID PANEL WITH LDL/HDL RATIO
Cholesterol, Total: 202 mg/dL — ABNORMAL HIGH (ref 100–199)
HDL: 74 mg/dL
LDL Chol Calc (NIH): 113 mg/dL — ABNORMAL HIGH (ref 0–99)
LDL/HDL Ratio: 1.5 ratio (ref 0.0–3.2)
Triglycerides: 82 mg/dL (ref 0–149)
VLDL Cholesterol Cal: 15 mg/dL (ref 5–40)

## 2023-05-28 LAB — HEMOGLOBIN A1C
Est. average glucose Bld gHb Est-mCnc: 111 mg/dL
Hgb A1c MFr Bld: 5.5 % (ref 4.8–5.6)

## 2023-05-28 LAB — TSH: TSH: 2.1 u[IU]/mL (ref 0.450–4.500)

## 2023-05-28 NOTE — Progress Notes (Signed)
 Hi Tristy, your cholesterol is up just a little. Continue to work on healthy diet and keep up the exercise. Thyroid and A1C look awesome!! Metabolic panel is normal.

## 2023-07-04 ENCOUNTER — Ambulatory Visit

## 2023-07-04 DIAGNOSIS — Z1231 Encounter for screening mammogram for malignant neoplasm of breast: Secondary | ICD-10-CM

## 2023-07-08 ENCOUNTER — Encounter: Payer: Self-pay | Admitting: Family Medicine

## 2023-07-08 NOTE — Progress Notes (Signed)
 Please call patient. Normal mammogram.  Repeat in 1 year.

## 2023-10-24 ENCOUNTER — Other Ambulatory Visit: Payer: Self-pay | Admitting: Family Medicine

## 2023-10-24 DIAGNOSIS — Z780 Asymptomatic menopausal state: Secondary | ICD-10-CM

## 2023-11-07 ENCOUNTER — Other Ambulatory Visit: Payer: Self-pay | Admitting: Family Medicine

## 2023-11-07 ENCOUNTER — Ambulatory Visit: Admitting: Family Medicine

## 2023-11-07 ENCOUNTER — Encounter: Payer: Self-pay | Admitting: Family Medicine

## 2023-11-07 VITALS — BP 163/91 | HR 86 | Ht 67.0 in | Wt 200.0 lb

## 2023-11-07 DIAGNOSIS — R7301 Impaired fasting glucose: Secondary | ICD-10-CM

## 2023-11-07 DIAGNOSIS — E038 Other specified hypothyroidism: Secondary | ICD-10-CM

## 2023-11-07 DIAGNOSIS — R002 Palpitations: Secondary | ICD-10-CM

## 2023-11-07 DIAGNOSIS — I10 Essential (primary) hypertension: Secondary | ICD-10-CM

## 2023-11-07 DIAGNOSIS — R0602 Shortness of breath: Secondary | ICD-10-CM | POA: Diagnosis not present

## 2023-11-07 LAB — POCT GLYCOSYLATED HEMOGLOBIN (HGB A1C): Hemoglobin A1C: 5.1 % (ref 4.0–5.6)

## 2023-11-07 NOTE — Assessment & Plan Note (Signed)
 Ischial blood pressure elevated will recheck before she goes home today.

## 2023-11-07 NOTE — Assessment & Plan Note (Signed)
 A1c looks great today at 5.1.  Fantastic job.

## 2023-11-07 NOTE — Progress Notes (Signed)
 Acute Office Visit  Subjective:     Patient ID: Kayla Taylor, female    DOB: 04-14-1960, 63 y.o.   MRN: [id number]  Chief Complaint  Patient presents with   Hypothyroidism    Pt reports that she has felt like a humming bird for the past week or so.     HPI Patient is in today for   Pt reports that she has felt like a humming bird for the past week or so.feels slightly winded and her heart rate goes up.  She says its almost the sensation of like taking a really brisk walk and breathing a little bit more hard and getting a little bit more winded but it is happening with just walking across the parking lot.  Feels more tired.  Feels shaky.  .no changes to her brand of thyroid  medication.   No other respiratory symptoms.  No fevers or chills.  No recent changes to medications no decongestants.  She denies any chest pain with activity.   Family history of heart failure and tricuspid valve prolapse.  ROS      Objective:    BP (!) 163/91   Pulse 86   Ht 5' 7 (1.702 m)   Wt 200 lb 0.6 oz (90.7 kg)   LMP 04/29/2016   SpO2 97%   BMI 31.33 kg/m    Physical Exam Vitals and nursing note reviewed.  Constitutional:      Appearance: Normal appearance.  HENT:     Head: Normocephalic and atraumatic.     Mouth/Throat:     Pharynx: Oropharynx is clear.  Eyes:     Conjunctiva/sclera: Conjunctivae normal.  Neck:     Thyroid : No thyromegaly.  Cardiovascular:     Rate and Rhythm: Normal rate and regular rhythm.  Pulmonary:     Effort: Pulmonary effort is normal.     Breath sounds: Normal breath sounds.  Abdominal:     Tenderness: There is no abdominal tenderness.  Musculoskeletal:        General: No swelling.  Skin:    General: Skin is warm and dry.  Neurological:     Mental Status: She is alert and oriented to person, place, and time.  Psychiatric:        Mood and Affect: Mood normal.        Behavior: Behavior normal.     No results found for any visits on  11/07/23.      Assessment & Plan:   Problem List Items Addressed This Visit       Cardiovascular and Mediastinum   Essential hypertension   Ischial blood pressure elevated will recheck before she goes home today.      Relevant Orders   TSH + free T4   CBC with Differential/Platelet   CMP14+EGFR   TSH   B Nat Peptide     Endocrine   IFG (impaired fasting glucose) - Primary   A1c looks great today at 5.1.  Fantastic job.      Relevant Orders   POCT HgB A1C   TSH + free T4   CBC with Differential/Platelet   CMP14+EGFR   TSH   B Nat Peptide   Hypothyroidism   Does not think the manufacturer has changed for her thyroid  medication she has been on the same dose for a while.  Will plan to recheck TSH today as it could be contributing to some of her symptoms.      Relevant Orders   TSH + free  T4   CBC with Differential/Platelet   CMP14+EGFR   TSH   B Nat Peptide   Other Visit Diagnoses       Palpitations       Relevant Orders   EKG 12-Lead   TSH + free T4   CBC with Differential/Platelet   CMP14+EGFR   TSH   B Nat Peptide     SOB (shortness of breath)       Relevant Orders   EKG 12-Lead   TSH + free T4   CBC with Differential/Platelet   CMP14+EGFR   TSH   B Nat Peptide       Shortness of breath and palpitations-will do an EKG today.  Will check labs as it could be that her thyroid  levels are off we will check electrolytes.  Will also check for anemia.  We did discuss that if lab work comes back normal consider further cardiac workup with Zio patch and echocardiogram.  EKG today shows rate of 78 bpm, normal sinus rhythm with no acute ST-T wave changes.  Did compared to prior EKG from August 2022 where baseline pulse was 62 bpm.  But again no significant changes by comparison.  No orders of the defined types were placed in this encounter.   Return in 2 weeks (on 11/21/2023), or if symptoms worsen or fail to improve, for nurse visit for BP  check.  Dorothyann Byars, MD

## 2023-11-07 NOTE — Assessment & Plan Note (Signed)
 Does not think the manufacturer has changed for her thyroid  medication she has been on the same dose for a while.  Will plan to recheck TSH today as it could be contributing to some of her symptoms.

## 2023-11-07 NOTE — Progress Notes (Signed)
Pt reports that  

## 2023-11-08 ENCOUNTER — Ambulatory Visit: Attending: Family Medicine

## 2023-11-08 ENCOUNTER — Ambulatory Visit: Payer: Self-pay | Admitting: Family Medicine

## 2023-11-08 ENCOUNTER — Other Ambulatory Visit: Payer: Self-pay | Admitting: Family Medicine

## 2023-11-08 DIAGNOSIS — R002 Palpitations: Secondary | ICD-10-CM

## 2023-11-08 DIAGNOSIS — I10 Essential (primary) hypertension: Secondary | ICD-10-CM

## 2023-11-08 DIAGNOSIS — R0602 Shortness of breath: Secondary | ICD-10-CM

## 2023-11-08 LAB — CBC WITH DIFFERENTIAL/PLATELET
Basophils Absolute: 0.1 x10E3/uL (ref 0.0–0.2)
Basos: 1 %
EOS (ABSOLUTE): 0.1 x10E3/uL (ref 0.0–0.4)
Eos: 1 %
Hematocrit: 44.8 % (ref 34.0–46.6)
Hemoglobin: 14.8 g/dL (ref 11.1–15.9)
Immature Grans (Abs): 0 x10E3/uL (ref 0.0–0.1)
Immature Granulocytes: 0 %
Lymphocytes Absolute: 2.6 x10E3/uL (ref 0.7–3.1)
Lymphs: 31 %
MCH: 31.6 pg (ref 26.6–33.0)
MCHC: 33 g/dL (ref 31.5–35.7)
MCV: 96 fL (ref 79–97)
Monocytes Absolute: 0.6 x10E3/uL (ref 0.1–0.9)
Monocytes: 7 %
Neutrophils Absolute: 4.9 x10E3/uL (ref 1.4–7.0)
Neutrophils: 60 %
Platelets: 275 x10E3/uL (ref 150–450)
RBC: 4.68 x10E6/uL (ref 3.77–5.28)
RDW: 12.3 % (ref 11.7–15.4)
WBC: 8.3 x10E3/uL (ref 3.4–10.8)

## 2023-11-08 LAB — CMP14+EGFR
ALT: 17 IU/L (ref 0–32)
AST: 18 IU/L (ref 0–40)
Albumin: 4.5 g/dL (ref 3.9–4.9)
Alkaline Phosphatase: 60 IU/L (ref 44–121)
BUN/Creatinine Ratio: 13 (ref 12–28)
BUN: 12 mg/dL (ref 8–27)
Bilirubin Total: 0.3 mg/dL (ref 0.0–1.2)
CO2: 25 mmol/L (ref 20–29)
Calcium: 9.2 mg/dL (ref 8.7–10.3)
Chloride: 99 mmol/L (ref 96–106)
Creatinine, Ser: 0.91 mg/dL (ref 0.57–1.00)
Globulin, Total: 2.8 g/dL (ref 1.5–4.5)
Glucose: 92 mg/dL (ref 70–99)
Potassium: 3.7 mmol/L (ref 3.5–5.2)
Sodium: 139 mmol/L (ref 134–144)
Total Protein: 7.3 g/dL (ref 6.0–8.5)
eGFR: 72 mL/min/1.73 (ref >59)

## 2023-11-08 LAB — TSH+FREE T4
Free T4: 1.49 ng/dL (ref 0.82–1.77)
TSH: 3.43 u[IU]/mL (ref 0.450–4.500)

## 2023-11-08 LAB — BRAIN NATRIURETIC PEPTIDE: BNP: 12.4 pg/mL (ref 0.0–100.0)

## 2023-11-08 NOTE — Progress Notes (Unsigned)
 EP to read.

## 2023-11-08 NOTE — Progress Notes (Signed)
 Orders Placed This Encounter  Procedures   LONG TERM MONITOR XT (3-14 DAYS)    Standing Status:   Future    Number of Occurrences:   1    Expiration Date:   11/07/2024    Where should this test be performed?:   [facility]    Does the patient have an implanted cardiac device?:   No    Prescribed days of wear:   14    Type of enrollment:   Home Enrollment    Vendor::   [employer]    Reason for Exam:   Palpitations R00.2   ECHOCARDIOGRAM COMPLETE    Palpitations    Standing Status:   Future    Expiration Date:   11/07/2024    Where should this test be performed:   [facility]    Perflutren DEFINITY (image enhancing agent) should be administered unless hypersensitivity or allergy exist:   Administer Perflutren    Reason for exam-Echo:   Dyspnea  R06.00

## 2023-11-08 NOTE — Progress Notes (Signed)
 Hi Kayla Taylor, your thyroid  level is OK but normally you are closer to 1-2. This time you were 3.4. so I don't think it is your thyroid  causing your symptoms. Would you feel comfortable with moving forward with echo and heart monitor.  Blood count and metabolic panel is normal.   BNP pending.

## 2023-11-12 NOTE — Progress Notes (Signed)
 BNP came back normal so no sign of heart failure.  Just try to check your blood pressure in the morning after you have sat for about 5 minutes maybe after breakfast.  Make sure feet are uncrossed.  And then take it again maybe later in the day after you get home and you have been able to sit and rest for a few minutes.

## 2023-11-13 NOTE — Telephone Encounter (Signed)
 Ok for caffeine for now. Sounds like she hasn't heard from scheding for the echo. We wil lneed to check on that

## 2023-11-18 NOTE — Telephone Encounter (Signed)
Ok to move appt out

## 2023-11-19 ENCOUNTER — Other Ambulatory Visit: Payer: Self-pay | Admitting: Family Medicine

## 2023-11-19 DIAGNOSIS — I10 Essential (primary) hypertension: Secondary | ICD-10-CM

## 2023-11-19 DIAGNOSIS — R002 Palpitations: Secondary | ICD-10-CM

## 2023-11-19 DIAGNOSIS — R0602 Shortness of breath: Secondary | ICD-10-CM

## 2023-11-21 ENCOUNTER — Other Ambulatory Visit: Payer: Self-pay | Admitting: Family Medicine

## 2023-11-21 ENCOUNTER — Ambulatory Visit

## 2023-11-21 ENCOUNTER — Ambulatory Visit (INDEPENDENT_AMBULATORY_CARE_PROVIDER_SITE_OTHER)

## 2023-11-21 VITALS — BP 139/79 | HR 87

## 2023-11-21 DIAGNOSIS — I10 Essential (primary) hypertension: Secondary | ICD-10-CM

## 2023-11-21 MED ORDER — LOSARTAN POTASSIUM 100 MG PO TABS: 100.0000 mg | ORAL_TABLET | Freq: Every day | ORAL | 1 refills | Status: DC

## 2023-11-21 NOTE — Addendum Note (Signed)
 Addended by: Jeral Zick D on: 11/21/2023 05:37 PM   Modules accepted: Orders

## 2023-11-21 NOTE — Progress Notes (Signed)
 Pt presents in office for blood pressure check last visit pt had  an elevated b/p reading of 163/91-158/95, she is currently taking  losartan  50 mg for her blood pressure ,pt returns to clinic to check on his pressures   pt first b/p  was 155/81  after sitting for a few minutes  her second b/p was 139/79.

## 2023-11-21 NOTE — Patient Instructions (Addendum)
 Your medications have changed to losartan  100 mg ,Return to clinic  in 2/3 weeks for labs and recheck blood pressure

## 2023-11-27 ENCOUNTER — Telehealth: Payer: Self-pay

## 2023-11-27 NOTE — Telephone Encounter (Signed)
 Copied from CRM #[id number]. Topic: Clinical - Medication Question >> Nov 27, 2023 11:32 AM Marda MATSU wrote: Corin with [facility]   Medication: losartan  (COZAAR ) 100 MG tablet  Question: has the medication been recently increased? Confirmation needed Rx on file says 50mg    Please advise

## 2023-11-27 NOTE — Telephone Encounter (Signed)
 Pharmacy given confirmation that Losartan  strength was changed from 50mg  to 100mg  on 11/21/2023.

## 2023-11-28 ENCOUNTER — Ambulatory Visit: Admitting: Family Medicine

## 2023-12-03 ENCOUNTER — Ambulatory Visit: Admitting: Family Medicine

## 2023-12-05 ENCOUNTER — Ambulatory Visit (HOSPITAL_BASED_OUTPATIENT_CLINIC_OR_DEPARTMENT_OTHER)
Admission: RE | Admit: 2023-12-05 | Discharge: 2023-12-05 | Disposition: A | Discharge status: Home / Self Care | Source: Ambulatory Visit | Attending: Family Medicine | Admitting: Family Medicine

## 2023-12-05 DIAGNOSIS — R002 Palpitations: Secondary | ICD-10-CM | POA: Insufficient documentation

## 2023-12-05 DIAGNOSIS — R0602 Shortness of breath: Secondary | ICD-10-CM | POA: Insufficient documentation

## 2023-12-06 DIAGNOSIS — R0602 Shortness of breath: Secondary | ICD-10-CM

## 2023-12-06 DIAGNOSIS — R002 Palpitations: Secondary | ICD-10-CM | POA: Diagnosis not present

## 2023-12-06 DIAGNOSIS — I10 Essential (primary) hypertension: Secondary | ICD-10-CM | POA: Diagnosis not present

## 2023-12-08 LAB — ECHOCARDIOGRAM COMPLETE
AR max vel: 1.59 cm2
AV Area VTI: 1.68 cm2
AV Area mean vel: 1.73 cm2
AV Mean grad: 5 mmHg
AV Peak grad: 10.9 mmHg
Ao pk vel: 1.65 m/s
Area-P 1/2: 5.54 cm2
Calc EF: 61.9 %
MV M vel: 4.91 m/s
MV Peak grad: 96.4 mmHg
S' Lateral: 2.7 cm
Single Plane A2C EF: 68.4 %
Single Plane A4C EF: 57.1 %

## 2023-12-09 ENCOUNTER — Ambulatory Visit: Payer: Self-pay | Admitting: Family Medicine

## 2023-12-09 DIAGNOSIS — E785 Hyperlipidemia, unspecified: Secondary | ICD-10-CM

## 2023-12-09 DIAGNOSIS — I10 Essential (primary) hypertension: Secondary | ICD-10-CM

## 2023-12-09 DIAGNOSIS — R0602 Shortness of breath: Secondary | ICD-10-CM

## 2023-12-09 DIAGNOSIS — R7301 Impaired fasting glucose: Secondary | ICD-10-CM

## 2023-12-09 DIAGNOSIS — R002 Palpitations: Secondary | ICD-10-CM

## 2023-12-09 NOTE — Progress Notes (Signed)
 Hi Abree, echocardiogram results of the heart showed good pumping function.  The wall is moving normally of the left ventricle as well which is great.  The mitral valve and the aortic valve overall look great.  Send no concerning findings with the mechanics and structure of the heart.

## 2023-12-12 ENCOUNTER — Ambulatory Visit: Payer: Self-pay | Admitting: Family Medicine

## 2023-12-12 NOTE — Progress Notes (Signed)
 Hi Kayla Taylor, heart monitor shows heart rate between 49 and 153 with the average closer to 84.  No atrial fibrillation which is great.  A couple of faster beats from the top part of the heart but nothing concerning there.  The triggered episodes were early beats so that is mostly what you were feeling when you were feeling the palpitations these are considered benign and not harmful.  This is very reassuring.

## 2023-12-17 ENCOUNTER — Ambulatory Visit (INDEPENDENT_AMBULATORY_CARE_PROVIDER_SITE_OTHER): Payer: Self-pay

## 2023-12-17 DIAGNOSIS — E785 Hyperlipidemia, unspecified: Secondary | ICD-10-CM

## 2023-12-17 DIAGNOSIS — R0602 Shortness of breath: Secondary | ICD-10-CM

## 2023-12-17 DIAGNOSIS — I10 Essential (primary) hypertension: Secondary | ICD-10-CM

## 2023-12-17 DIAGNOSIS — R7301 Impaired fasting glucose: Secondary | ICD-10-CM

## 2023-12-17 DIAGNOSIS — R002 Palpitations: Secondary | ICD-10-CM

## 2023-12-20 ENCOUNTER — Ambulatory Visit: Payer: Self-pay | Admitting: Family Medicine

## 2023-12-20 NOTE — Progress Notes (Signed)
 Hi Kayla Taylor, the calcium cardiac score was 0 which is wonderful.  So no sign of excess calcium deposit in the coronary arteries.

## 2023-12-23 NOTE — Progress Notes (Signed)
 We will discuss the read on surrounding tissue when I see you. Will need repeat Lung CT in 12 months.

## 2024-01-01 ENCOUNTER — Other Ambulatory Visit: Payer: Self-pay | Admitting: Family Medicine

## 2024-01-01 DIAGNOSIS — Z780 Asymptomatic menopausal state: Secondary | ICD-10-CM

## 2024-01-22 ENCOUNTER — Other Ambulatory Visit: Payer: Self-pay | Admitting: Family Medicine

## 2024-01-22 DIAGNOSIS — Z780 Asymptomatic menopausal state: Secondary | ICD-10-CM

## 2024-01-31 ENCOUNTER — Encounter: Payer: Self-pay | Admitting: Family Medicine

## 2024-01-31 DIAGNOSIS — I10 Essential (primary) hypertension: Secondary | ICD-10-CM

## 2024-01-31 MED ORDER — LOSARTAN POTASSIUM 50 MG PO TABS: 50.0000 mg | ORAL_TABLET | Freq: Two times a day (BID) | ORAL | 3 refills | Status: DC

## 2024-02-10 ENCOUNTER — Other Ambulatory Visit: Payer: Self-pay | Admitting: *Deleted

## 2024-02-10 DIAGNOSIS — I10 Essential (primary) hypertension: Secondary | ICD-10-CM

## 2024-02-10 MED ORDER — LOSARTAN POTASSIUM 50 MG PO TABS: 50.0000 mg | ORAL_TABLET | Freq: Two times a day (BID) | ORAL | 1 refills | Status: AC

## 2024-02-12 ENCOUNTER — Encounter: Payer: Self-pay | Admitting: Family Medicine

## 2024-02-12 ENCOUNTER — Ambulatory Visit: Admitting: Family Medicine

## 2024-02-12 VITALS — BP 132/82 | HR 77 | Ht 67.0 in | Wt 202.0 lb

## 2024-02-12 DIAGNOSIS — I10 Essential (primary) hypertension: Secondary | ICD-10-CM

## 2024-02-12 DIAGNOSIS — R7301 Impaired fasting glucose: Secondary | ICD-10-CM

## 2024-02-12 DIAGNOSIS — E038 Other specified hypothyroidism: Secondary | ICD-10-CM | POA: Diagnosis not present

## 2024-02-12 LAB — POCT GLYCOSYLATED HEMOGLOBIN (HGB A1C): Hemoglobin A1C: 5.5 % (ref 4.0–5.6)

## 2024-02-12 NOTE — Assessment & Plan Note (Signed)
 Feeling much better in regard to SOB and palps after chaning the BP medication.

## 2024-02-12 NOTE — Progress Notes (Signed)
 Established Patient Office Visit  Patient ID: Kayla Taylor, female    DOB: 17-Jun-1960  Age: 63 y.o. MRN: [id number] PCP: Alvan Dorothyann BIRCH, MD  Chief Complaint  Patient presents with   ifg   Hypertension    Subjective:     HPI  Discussed the use of AI scribe software for clinical note transcription with the patient, who gave verbal consent to proceed.  History of Present Illness Kayla Taylor is a 63 year old female who presents for a follow-up visit to discuss recent imaging findings and overall health maintenance.  Pulmonary imaging findings and respiratory history - Recent calcium score test revealed patchy areas of linear atelectasis or scarring throughout the bilateral lungs - Several non-calcifying pulmonary nodules identified, largest measuring 3x5 mm in the left lower lung - History of frequent chest and ear infections during childhood - Exposure to secondhand smoke from grandmother, who smoked heavily indoors  Cardiovascular health and blood pressure management - A1c level is 5.5 - Maintains a diet low in carbohydrates and high in vegetables - Occasionally checks blood pressure at home - No recent chest pain, palpitations, or other cardiovascular symptoms since blood pressure medication dose adjustment  Hypothyroid - No skin or hair changes     ROS    Objective:     BP 132/82   Pulse 77   Ht 5' 7 (1.702 m)   Wt 202 lb (91.6 kg)   LMP 04/29/2016   SpO2 100%   BMI 31.64 kg/m    Physical Exam Vitals and nursing note reviewed.  Constitutional:      Appearance: Normal appearance.  HENT:     Head: Normocephalic and atraumatic.  Eyes:     Conjunctiva/sclera: Conjunctivae normal.  Cardiovascular:     Rate and Rhythm: Normal rate and regular rhythm.  Pulmonary:     Effort: Pulmonary effort is normal.     Breath sounds: Normal breath sounds.  Skin:    General: Skin is warm and dry.  Neurological:     Mental Status: She is alert.   Psychiatric:        Mood and Affect: Mood normal.      Results for orders placed or performed in visit on 02/12/24  POCT HgB A1C  Result Value Ref Range   Hemoglobin A1C 5.5 4.0 - 5.6 %   HbA1c POC (<> result, manual entry)     HbA1c, POC (prediabetic range)     HbA1c, POC (controlled diabetic range)        The 10-year ASCVD risk score (Arnett DK, et al., 2019) is: 4.4%    Assessment & Plan:   Problem List Items Addressed This Visit       Cardiovascular and Mediastinum   Essential hypertension - Primary   Feeling much better in regard to SOB and palps after chaning the BP medication.         Endocrine   IFG (impaired fasting glucose)   Relevant Orders   POCT HgB A1C (Completed)   Hypothyroidism    Assessment and Plan Assessment & Plan Impaired fasting glucose A1c is 5.5, indicating good control. - Continue dietary habits focusing on moderation of sugars and carbohydrates.  Essential hypertension Blood pressure is well-controlled with current medication. No recent chest pain or palpitations. - Continue current blood pressure medication regimen. - Encouraged regular home blood pressure monitoring.  Hypothyroidism No symptoms or concerns related to thyroid  function. - Continue current thyroid  medication regimen.  Pulmonary nodules and linear atelectasis/scarring, bilateral  lungs Ultrasound showed patchy linear atelectasis and scarring with non-calcifying nodules. Largest nodule 3x5 mm in left lower lung. No mass or consolidation. Follow-up CT in 12 months recommended. - Order follow-up CT scan of lungs in 12 months. - Encouraged regular physical activity and deep breathing exercises.  General Health Maintenance Discussed pneumococcal pneumonia vaccine for individuals over 50. Explained long-term protection. - Consider pneumococcal pneumonia vaccine and inform office if decision is made to proceed.  Return in about 6 months (around 08/12/2024) for  Hypertension.    Dorothyann Byars, MD [facility] at [facility]

## 2024-03-07 ENCOUNTER — Encounter: Payer: Self-pay | Admitting: Family Medicine

## 2024-03-09 NOTE — Telephone Encounter (Signed)
 Ok to take, just make sure at least 4 hours away from thyroid  medication

## 2024-03-09 NOTE — Telephone Encounter (Signed)
LM with recommendations

## 2024-08-13 ENCOUNTER — Ambulatory Visit: Admitting: Family Medicine
